# Patient Record
Sex: Male | Born: 1950 | Race: White | Hispanic: No | Marital: Married | Smoking: Former smoker
Health system: Southern US, Community
[De-identification: ages and names within clinical notes are randomized; demographics above are authoritative.]

## PROBLEM LIST (undated history)

## (undated) DIAGNOSIS — F419 Anxiety disorder, unspecified: Secondary | ICD-10-CM

## (undated) DIAGNOSIS — D649 Anemia, unspecified: Secondary | ICD-10-CM

## (undated) DIAGNOSIS — I259 Chronic ischemic heart disease, unspecified: Secondary | ICD-10-CM

## (undated) DIAGNOSIS — IMO0001 Reserved for inherently not codable concepts without codable children: Secondary | ICD-10-CM

## (undated) DIAGNOSIS — K449 Diaphragmatic hernia without obstruction or gangrene: Secondary | ICD-10-CM

## (undated) DIAGNOSIS — K2980 Duodenitis without bleeding: Secondary | ICD-10-CM

## (undated) DIAGNOSIS — K922 Gastrointestinal hemorrhage, unspecified: Secondary | ICD-10-CM

## (undated) DIAGNOSIS — Z5189 Encounter for other specified aftercare: Secondary | ICD-10-CM

## (undated) DIAGNOSIS — R6889 Other general symptoms and signs: Secondary | ICD-10-CM

## (undated) DIAGNOSIS — R972 Elevated prostate specific antigen [PSA]: Secondary | ICD-10-CM

## (undated) DIAGNOSIS — K259 Gastric ulcer, unspecified as acute or chronic, without hemorrhage or perforation: Secondary | ICD-10-CM

## (undated) DIAGNOSIS — C801 Malignant (primary) neoplasm, unspecified: Secondary | ICD-10-CM

## (undated) DIAGNOSIS — I252 Old myocardial infarction: Secondary | ICD-10-CM

## (undated) DIAGNOSIS — E785 Hyperlipidemia, unspecified: Secondary | ICD-10-CM

## (undated) HISTORY — DX: Encounter for other specified aftercare: Z51.89

## (undated) HISTORY — PX: OTHER SURGICAL HISTORY: SHX169

## (undated) HISTORY — DX: Chronic ischemic heart disease, unspecified: I25.9

## (undated) HISTORY — DX: Other general symptoms and signs: R68.89

## (undated) HISTORY — PX: TONSILLECTOMY: SUR1361

## (undated) HISTORY — DX: Duodenitis without bleeding: K29.80

## (undated) HISTORY — DX: Old myocardial infarction: I25.2

## (undated) HISTORY — DX: Gastric ulcer, unspecified as acute or chronic, without hemorrhage or perforation: K25.9

## (undated) HISTORY — DX: Malignant (primary) neoplasm, unspecified: C80.1

## (undated) HISTORY — DX: Anemia, unspecified: D64.9

## (undated) HISTORY — DX: Reserved for inherently not codable concepts without codable children: IMO0001

## (undated) HISTORY — PX: PROSTATE BIOPSY: SHX241

## (undated) HISTORY — DX: Gastrointestinal hemorrhage, unspecified: K92.2

## (undated) HISTORY — DX: Diaphragmatic hernia without obstruction or gangrene: K44.9

## (undated) HISTORY — DX: Hyperlipidemia, unspecified: E78.5

---

## 2002-10-09 DIAGNOSIS — I252 Old myocardial infarction: Secondary | ICD-10-CM

## 2002-10-09 HISTORY — DX: Old myocardial infarction: I25.2

## 2002-12-21 ENCOUNTER — Inpatient Hospital Stay (HOSPITAL_COMMUNITY): Admission: EM | Admit: 2002-12-21 | Discharge: 2002-12-26 | Payer: Self-pay | Admitting: Emergency Medicine

## 2002-12-24 ENCOUNTER — Encounter: Payer: Self-pay | Admitting: Cardiology

## 2003-01-19 ENCOUNTER — Encounter (HOSPITAL_COMMUNITY): Admission: RE | Admit: 2003-01-19 | Discharge: 2003-04-19 | Payer: Self-pay | Admitting: Cardiology

## 2003-03-08 ENCOUNTER — Inpatient Hospital Stay (HOSPITAL_COMMUNITY): Admission: EM | Admit: 2003-03-08 | Discharge: 2003-03-10 | Payer: Self-pay

## 2003-03-09 HISTORY — PX: CARDIAC CATHETERIZATION: SHX172

## 2010-09-23 ENCOUNTER — Ambulatory Visit: Payer: Self-pay | Admitting: Cardiology

## 2010-10-09 HISTORY — PX: SKIN GRAFT: SHX250

## 2011-02-06 ENCOUNTER — Other Ambulatory Visit: Payer: Self-pay | Admitting: *Deleted

## 2011-02-06 DIAGNOSIS — E7800 Pure hypercholesterolemia, unspecified: Secondary | ICD-10-CM

## 2011-02-13 ENCOUNTER — Ambulatory Visit: Payer: Self-pay | Admitting: Cardiology

## 2011-02-23 ENCOUNTER — Encounter: Payer: Self-pay | Admitting: *Deleted

## 2011-02-23 DIAGNOSIS — E785 Hyperlipidemia, unspecified: Secondary | ICD-10-CM

## 2011-02-23 DIAGNOSIS — I259 Chronic ischemic heart disease, unspecified: Secondary | ICD-10-CM

## 2011-02-23 DIAGNOSIS — I252 Old myocardial infarction: Secondary | ICD-10-CM

## 2011-02-24 ENCOUNTER — Encounter: Payer: Self-pay | Admitting: Cardiology

## 2011-02-24 ENCOUNTER — Other Ambulatory Visit (INDEPENDENT_AMBULATORY_CARE_PROVIDER_SITE_OTHER): Payer: 59 | Admitting: *Deleted

## 2011-02-24 ENCOUNTER — Ambulatory Visit (INDEPENDENT_AMBULATORY_CARE_PROVIDER_SITE_OTHER): Payer: 59 | Admitting: Cardiology

## 2011-02-24 VITALS — BP 118/82 | HR 55 | Ht 72.0 in | Wt 167.4 lb

## 2011-02-24 DIAGNOSIS — I259 Chronic ischemic heart disease, unspecified: Secondary | ICD-10-CM

## 2011-02-24 DIAGNOSIS — E7800 Pure hypercholesterolemia, unspecified: Secondary | ICD-10-CM

## 2011-02-24 DIAGNOSIS — E785 Hyperlipidemia, unspecified: Secondary | ICD-10-CM

## 2011-02-24 DIAGNOSIS — I2510 Atherosclerotic heart disease of native coronary artery without angina pectoris: Secondary | ICD-10-CM

## 2011-02-24 LAB — BASIC METABOLIC PANEL
BUN: 13 mg/dL (ref 6–23)
CO2: 29 mEq/L (ref 19–32)
Calcium: 9.4 mg/dL (ref 8.4–10.5)
Creatinine, Ser: 0.9 mg/dL (ref 0.4–1.5)
GFR: 87.57 mL/min (ref >60.00)
Glucose, Bld: 80 mg/dL (ref 70–99)
Potassium: 4.7 mEq/L (ref 3.5–5.1)
Sodium: 140 mEq/L (ref 135–145)

## 2011-02-24 LAB — HEPATIC FUNCTION PANEL
AST: 26 U/L (ref 0–37)
Alkaline Phosphatase: 64 U/L (ref 39–117)
Bilirubin, Direct: 0.1 mg/dL (ref 0.0–0.3)
Total Bilirubin: 0.6 mg/dL (ref 0.3–1.2)
Total Protein: 6.4 g/dL (ref 6.0–8.3)

## 2011-02-24 LAB — LIPID PANEL
HDL: 45.5 mg/dL (ref >39.00)
Triglycerides: 69 mg/dL (ref 0.0–149.0)
VLDL: 13.8 mg/dL (ref 0.0–40.0)

## 2011-02-24 NOTE — Cardiovascular Report (Signed)
NAME:  TREVIONE, WERT                            ACCOUNT NO.:  [id number]   MEDICAL RECORD NO.:  [id number]                   PATIENT TYPE:  INP   LOCATION:  1823                                 FACILITY:  [facility]   PHYSICIAN:  Colleen Can. Deborah Chalk, M.D.            DATE OF BIRTH:  [birth year]   DATE OF PROCEDURE:  DATE OF DISCHARGE:                              CARDIAC CATHETERIZATION   HISTORY:  The patient is a 60 year old gentleman with two days of substernal  chest pain.  He presented to the emergency room with EKG changes consistent  with a lateral myocardial infarction as well as with intermittent stuttering  chest pain and positive cardiac enzymes.  He was referred for  catheterization.   PROCEDURE:  1. Left heart catheterized with selective coronary angiography.  2. Left ventricular angiography.  3. Percutaneous coronary intervention of the left circumflex coronary.   TYPE AND SITE OF ENTRY:  Percutaneous right femoral artery.   CATHETERS:  A 6-French 4.0 curved Judkins right and left coronary catheter.  A 6-French pigtail ventricular catheter.  A 7-French JL-4 guiding, high-  torque floppy guidewire, 2.5 x20 mm Maverick balloon, a 3.0 x18 mm CYPHER  stent.   CONTRAST MATERIAL:  Omnipaque.   MEDICATIONS GIVEN DURING THE PROCEDURE:  Versed 5 mg IV, intracoronary  nitroglycerin, Integrilin and IV nitroglycerin.   COMMENTS:  The patient tolerated the procedure well.  He was quite anxious  before the procedure, but had nice relief of anxiety with Versed, but  remained awake during the procedure.   HEMODYNAMIC DATA:  The aortic pressure was 100/66, LV 100/2-13.  There is no  aortic valve gradient seen on pullback.   ANGIOGRAPHIC DATA:  1. The left main coronary artery has a 10% distal left narrowing.  2. The left circumflex:  The left circumflex is totally occluded after a     small, proximal obtuse marginal.  The obtuse marginal arises     approximately 3 cm from the  origin.  3. Left anterior descending:  The left anterior descending is a moderate-     sized vessel that extends around the apex.  There is a high-diagonal     vessel, with 50-70% segmental narrowing proximally.  It would be possible     to be approached with PCI, although it does arise in the proximal     anterior descending distribution.  It is also such that it probably could     be managed medically, but it is a reasonably large vessel.  4. Right coronary artery:  The right coronary artery is a dominant vessel.     It has segmental 50% narrowing proximally.  There is 50-60% ostial     posterior descending narrowing present.  There are no collaterals to the     distal left circumflex from the right coronary artery.   LEFT VENTRICULAR ANGIOGRAM:  The left ventricular angiogram is performed  in  the RAO position.  In the RAO position,  the ejection fraction was basically  in the normal ranges with only minimal inferoapical hypokinesis.  The global  ejection fraction was estimated to be 60.   ANGIOPLASTY PROCEDURE:  Using a JL-4 guide, we were able to cross the lesion  with a high torque floppy guidewire using the maverick balloon for support.  We were then able to dilate with the maverick balloon both initially, and  then further down in the vessel.  We injected intracoronary nitroglycerin  and subsequently distal spasm was relieved with satisfactory flow.  It was  felt that the area of the vessel with irregularity, involved in acute clot  and plaque formation was in the proximal segment.  A 3.0 x18 mm CYPHER stent  was placed with inflation to 12 atmospheres.  The final angiographic result  was felt to be excellent with slight over-expansion of the artery with a  CYPHER stent, but with no evidence of distal intimal tears.  Overall, the  patient tolerated the procedure well.   OVERALL IMPRESSION:  1. Acute posterolateral myocardial infarction with occlusion of the left     circumflex  with successful angioplasty.  2. Residual mild-to-moderate coronary atherosclerosis in the right coronary     artery and proximal large diagonal vessel with minimal atherosclerosis in     the distal left main coronary artery.   DISCUSSION:  It is felt that probably we can manage this patient medically  at this point in time.  He will need aggressive risk factor intervention  since he has not had medical care for over 15 years.  He will need to stop  smoking.  It may be that he would be a candidate for PCI of the proximal  diagonal vessel.                                               Colleen Can. Deborah Chalk, M.D.    SNT/MEDQ  D:  12/21/2002  T:  12/21/2002  Job:  [id number]

## 2011-02-24 NOTE — H&P (Signed)
Ricardo Arnold, Ricardo Arnold                            ACCOUNT NO.:  [id number]   MEDICAL RECORD NO.:  [id number]                   PATIENT TYPE:  INP   LOCATION:  2040                                 FACILITY:  [facility]   PHYSICIAN:  Colleen Can. Deborah Chalk, M.D.            DATE OF BIRTH:  02/08/1951   DATE OF ADMISSION:  03/08/2003  DATE OF DISCHARGE:                                HISTORY & PHYSICAL   REASON FOR ADMISSION:  Unstable angina, to rule out myocardial infarction.   HISTORY OF PRESENT ILLNESS:  The patient is a 60 year old male with a  previous myocardial infarction and stent of left circumflex on December 22, 2002.  He was playing music last night and a camped out after he played.  He  did not sleep well.  He drove home earlier on the day of admission and  unloaded his truck.  He subsequently mowed yard with a riding mower.  He  then developed a vague substernal chest pressure and indigestion-like  symptoms with vague radiation to his arm.  Nitroglycerin brought about  relief.  He had recurrent discomfort with relief again with nitroglycerin.  He took Pepto-Bismol and then over 20 to 30 minutes later he had increasing  anxiety and vague left anterior numbness and was referred to the emergency  room.  At the time of his catheterization in mid March of 2004, he had left  circumflex occlusion and subsequent stent placement with 3.0 x 18 mm Cypher  stent.  He had residual mild to moderate coronary atherosclerosis with 50 to  60% narrowing of the right coronary artery and a proximal diagonal vessel  with moderate 50 to 60% narrowing and minimal atherosclerosis in the left  main coronary artery.   CURRENT MEDICATIONS:  1. Plavix 75 mg a day.  2. Aspirin.  3. Lipitor 10 mg a day.   PAST MEDICAL HISTORY:  Other than myocardial infarction, this is essentially  negative.  He has not had any real care medically for 15 years prior to his  infarction.   PAST SURGICAL HISTORY:  None.   ALLERGIES:  No known drug allergies.   FAMILY HISTORY:  Father had bypass surgery in February of 2004.  He was in  his 5s.  Mother is 43, alive and well.  One sister is doing well.   REVIEW OF SYSTEMS:  He has basically been doing well.  He has been  exercising in the rehab program.   PHYSICAL EXAMINATION:  GENERAL APPEARANCE:  He is a pleasant white male.  VITAL SIGNS:  Blood pressure 125/84, heart rate 61, respiratory rate 16.  HEENT:  Negative.  NECK:  Supple without bruits.  LUNGS:  Clear.  CARDIOVASCULAR:  Regular rate and rhythm.  ABDOMEN:  Soft, normal bowel sounds.  EXTREMITIES:  No edema.  Good pedal pulses.   EKG shows some age indeterminate inferior infarction.   IMPRESSION:  1.  Vague chest discomfort and indigestion, questionable myocardial ischemia.  2. Status post inferior and posterior myocardial infarction in March of 2004     with stent placement in the left circumflex.   PLAN:  Will admit. Will rule out myocardial infarction.  Will put the  patient on IV heparin and IV nitroglycerin.  Will consider cardiac  catheterization.                                               Colleen Can. Deborah Chalk, M.D.    SNT/MEDQ  D:  03/09/2003  T:  03/09/2003  Job:  [id number]

## 2011-02-24 NOTE — Progress Notes (Signed)
Subjective:   Ricardo Arnold comes in today for followup visit. Overall, he continues to do well he's riding his bike on a regular basis. He's not had any recurrent chest pain. His last stress Cardiolite study was in 2009. He had a lateral myocardial infarction in March 2004 treated with a 3.0 x 18 mm Cypher stent. Followup catheterization in 2004 showed persistent patency of the stented segment but mild to moderate diffuse coronary atherosclerosis. He is continued to do well since that time. He remains on chronic aspirin and Plavix as well as statin therapy.   Current Outpatient Prescriptions  Medication Sig Dispense Refill  . aspirin 81 MG tablet Take 325 mg by mouth daily.       Marland Kitchen atorvastatin (LIPITOR) 20 MG tablet Take 20 mg by mouth daily.        . clopidogrel (PLAVIX) 75 MG tablet Take 75 mg by mouth daily.          No Known Allergies  Patient Active Problem List  Diagnoses  . Ischemic heart disease  . Hyperlipidemia  . History of acute lateral wall MI    History  Smoking status  . Former Smoker -- 1.0 packs/day for 32 years  . Types: Cigarettes  . Quit date: 10/09/2002  Smokeless tobacco  . Never Used    History  Alcohol Use No    Family History  Problem Relation Age of Onset  . Coronary artery disease Father     had bypass in his 59's    Review of Systems:   The patient denies any heat or cold intolerance.  No weight gain or weight loss.  The patient denies headaches or blurry vision.  There is no cough or sputum production.  The patient denies dizziness.  There is no hematuria or hematochezia.  The patient denies any muscle aches or arthritis.  The patient denies any rash.  The patient denies frequent falling or instability.  There is no history of depression or anxiety.  All other systems were reviewed and are negative.   Physical Exam:   Blood pressure is 118/82. Heart rate 55. Weight is 167.The head is normocephalic and atraumatic.  Pupils are equally round and reactive  to light.  Sclerae nonicteric.  Conjunctiva is clear.  Oropharynx is unremarkable.  There's adequate oral airway.  Neck is supple there are no masses.  Thyroid is not enlarged.  There is no lymphadenopathy.  Lungs are clear.  Chest is symmetric.  Heart shows a regular rate and rhythm.  S1 and S2 are normal.  There is no murmur click or gallop.  Abdomen is soft normal bowel sounds.  There is no organomegaly.  Genital and rectal deferred.  Extremities are without edema.  Peripheral pulses are adequate.  Neurologically intact.  Full range of motion.  The patient is not depressed.  Skin is warm and dry.  Assessment / Plan:

## 2011-02-24 NOTE — Discharge Summary (Signed)
NAME:  Ricardo Arnold, Ricardo Arnold                            ACCOUNT NO.:  [id number]   MEDICAL RECORD NO.:  [id number]                   PATIENT TYPE:  INP   LOCATION:  2003                                 FACILITY:  [facility]   PHYSICIAN:  Colleen Can. Deborah Chalk, M.D.            DATE OF BIRTH:  10/01/1951   DATE OF ADMISSION:  12/21/2002  DATE OF DISCHARGE:  12/26/2002                                 DISCHARGE SUMMARY   PRIMARY DISCHARGE DIAGNOSIS:  Lateral wall myocardial infarction with  subsequent emergent cardiac catheterization with occlusion of the left  circumflex and subsequent successful angioplasty and Cypher stent placement.  There was residual mild to moderate coronary disease in the right coronary  and the proximal large diagonal vessel with minimal disease in the distal  left main.   SECONDARY DISCHARGE DIAGNOSES:  1. Tobacco abuse.  2. Hyperlipidemia.  3. Positive family history for coronary disease.   HISTORY OF PRESENT ILLNESS:  The patient is a 60 year old male who is  married with one daughter who presents to the emergency room with EKG  changes consistent with a lateral wall myocardial infarction as well as with  intermittent stuttering chest pain and positive cardiac enzymes.  He was  seen and evaluated and referred for emergent cardiac catheterization.   Please see the dictated history and physical for further patient  presentation and profile.   LABORATORY DATA:  On admission CBC showed white count 11,000, hematocrit 38.  Chemistries were normal.  Glucose mildly elevated at 117.  Peak cardiac MB  was 160.   Total cholesterol 202, triglycerides 123, LDL 135, HDL 42.   CHEST X-RAY:  Showed no active disease.   HOSPITAL COURSE:  The patient was admitted.  He went emergently to the  Cardiac Catheterization lab and that procedure was performed with no known  complications.  There was occlusion of the left circumflex with subsequent  successful angioplasty as well as stent  placement with a 3.0 x 18 mm Cypher  stent inflated to a maximum of 12 atmospheres.  The final angiographic  result was felt to be excellent with slight over-expansion of the artery  with the stent but with no evidence of distal intimal tears.  Post-  procedure, he was transferred to the Coronary Care Unit where he remained  there for the next 24 hours.  His activity was advanced and tolerated  without problems.  Cardiac rehab was initiated.  Smoking cessation consult  was carried out.  His blood pressure has remained low throughout the  remainder of his hospitalization and has inhibited the initiation of ACE  inhibitor as well as beta blocker therapy.   Today, on December 26, 2002, he is doing well without complaints.  He has  continued to have no episodes of chest pain.  Blood pressure is only 94/50,  heart rate is in the 60s.  His overall physical exam is unremarkable and he  is felt to be a stable candidate for discharge today.   DISCHARGE CONDITION:  Improved.   DISCHARGE MEDICATIONS:  1. Plavix 75 mg a day.  2. Aspirin daily.  3. Lipitor 10 mg a day.  4. Nitroglycerin p.r.n.   ACTIVITY:  To be light with no driving, no sexual intercourse.   SPECIAL INSTRUCTIONS:  No smoking.   FOLLOW UP:  Will have him follow up in our office in approximately 10 days.  He is asked to call to set that appointment up, or certainly sooner if  problems would arise in the interim.     Juanell Fairly C. Earl Gala, N.P.                 Colleen Can. Deborah Chalk, M.D.    LCO/MEDQ  D:  12/26/2002  T:  12/27/2002  Job:  [id number]

## 2011-02-24 NOTE — Discharge Summary (Signed)
Ricardo Arnold, Ricardo Arnold                            ACCOUNT NO.:  [id number]   MEDICAL RECORD NO.:  [id number]                   PATIENT TYPE:  INP   LOCATION:  2040                                 FACILITY:  [facility]   PHYSICIAN:  Colleen Can. Deborah Chalk, M.D.            DATE OF BIRTH:  Apr 04, 1951   DATE OF ADMISSION:  03/08/2003  DATE OF DISCHARGE:  03/10/2003                                 DISCHARGE SUMMARY   PRIMARY DISCHARGE DIAGNOSIS:  Recurrent episode of chest pain with  subsequent elective cardiac catheterization, revealing proximal 20% left  main, 20-40% mid left anterior descending, 50-60% first diagonal, patent  stent in the left circumflex with a 50-60% narrowing in the small first  obtuse marginal branch, and 50-60% mid right coronary artery lesion with 60%  ostial posterior descending artery.  The patient will continue treatment  medically.  He is felt to have stable revascularization.   SECONDARY DISCHARGE DIAGNOSES:  1. Gastroesophageal reflux disease, now on proton pump inhibitor.  2. Hyperlipidemia, currently on Lipitor.  3. Previous myocardial infarction with stent placement in March 2004.   HISTORY OF PRESENT ILLNESS:  The patient is a very pleasant 60 year old  white male who has known coronary disease.  He had a previous heart attack  in March which was treated with stenting at the left circumflex.  He  presented to the hospital with complaints of chest pain.  On the night prior  to admission, he was placing music while camping out.  He did not sleep very  well.  He drove home the day of admission, unloaded his truck, and  subsequently mowed the yard.  He began to develop a vague substernal chest  pressure with indigestion-like feeling, somewhat very similar to his  previous chest pain syndrome.  He took a nitroglycerin with only short-term  relief.  However, the pain would return.  He took Pepto-Bismol after calling  the physician on call, but 20-30 minutes later, he  became quite anxious with  vague left arm numbness.  He was subsequently brought to the emergency room  and was admitted for further evaluation.   Please see dictated history and physical regarding the patient's  presentation.   LABORATORY DATA:  (On the day of admission)  Cardiac enzymes were negative.  CBC and chemistries were normal, except for a glucose of 125.  EKG showed no  acute changes.   HOSPITAL COURSE:  The patient was admitted.  We proceeded on with cardiac  catheterization the following day.  These results are as noted above.  It is  felt that at this time he may continue with medical management.  His overall  prognosis is quite good.  On March 10, 2003, he was doing well without  complaints.  He is tolerating his current medications without problems.  The  right groin is unremarkable, and he is felt to be stable for discharge  today.  CONDITION ON DISCHARGE:  Stable.   DISCHARGE MEDICATIONS:  He will resume his aspirin, Lipitor and Plavix, as  taken previously.  We will add Nexium 40 mg q.d.   ACTIVITY:  Light activity over the next few days.    WOUND CARE:  He is to place an ice pack to the wound if needed.   FOLLOW UP:  We will see him back at his regular appointment time, certainly  sooner if problems arise.     Juanell Fairly C. Earl Gala, N.P.                 Colleen Can. Deborah Chalk, M.D.    LCO/MEDQ  D:  03/10/2003  T:  03/10/2003  Job:  [id number]   cc:   Gwen Pounds, M.D.  [address]  [place]  [place] [address]  Fax: [phone]

## 2011-02-24 NOTE — H&P (Signed)
NAME:  CONTRELL, BALLENTINE                            ACCOUNT NO.:  [id number]   MEDICAL RECORD NO.:  [id number]                   PATIENT TYPE:  INP   LOCATION:  1823                                 FACILITY:  [facility]   PHYSICIAN:  Colleen Can. Deborah Chalk, M.D.            DATE OF BIRTH:  04/18/1951   DATE OF ADMISSION:  12/21/2002  DATE OF DISCHARGE:                                HISTORY & PHYSICAL   HISTORY:  Mr. Kathol is a 60 year old male.  He is married with one 12-year  old daughter.  He smokes one half pack of cigarettes a day, rarely uses  alcohol.  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   HISTORY OF PRESENT ILLNESS:  On Friday evening, two days prior to admission  he began having substernal chest pain and tightness through most of the  night.  He thought it was indigestion.  It was non radiating, there was no  nausea, diaphoresis and no shortness of breath.  It was somewhat steady  until he fell asleep.  He had similar chest pain on Saturday.  It returned  Saturday evening.  He did not get much sleep last night.  The discomfort  radiated somewhat to his shoulders.  He awoke and was uncomfortable.  It has  persisted mainly throughout the day but it has off and on quality and  feature.  He presented to the emergency room for evaluation.  EKG was  abnormal as well as positive CK and troponin's.  He is referred for  evaluation.   PAST MEDICAL HISTORY:  He has not really had any medical care for 15 years  but notes that he has been in good health.  There is no hypertension or  diabetes that he knows about.  He basically has been free of any significant  symptoms.   PAST SURGICAL HISTORY:  None.   ALLERGIES:  None.   MEDICATIONS:  None.   FAMILY HISTORY:  Father had bypass surgery in February of this year.  He was  in his 51's.  Mother is 70 and alive and well.  One sister is  okay.   REVIEW OF SYSTEMS:  He has had a mild cough and cold in the last two weeks  otherwise review of systems is totally within normal limits.   PHYSICAL EXAMINATION:  GENERAL:  He is a pleasant, quiet, thin white male.  VITALS:  Heart rate is 54, blood pressure is 90/54.  HEENT:  Negative.  Oropharynx is unremarkable.  NECK:  Supple, without bruits.  LUNGS:  Clear.  HEART:  Regular rate and rhythm, no murmur.  ABDOMEN:  Soft, nontender.  EXTREMITIES:  Without edema.  Peripheral pulses are 2+.   LABORATORY DATA:  His EKG shows an RSR prime in 1 and aVL with an S-T  elevation mildly, is normal sinus  rhythm.   Enzymes - cardiac enzymes are positive (I do not have exact numbers in front  of me).   His BUN, creatinine and chemistry are all within normal limits.  Hematocrit  is 50 and CO2 is 46.   IMPRESSION:  1. Probable acute lateral myocardial infarction of greater than 24 hours     duration but has stuttering symptoms.  2. Cigarette abuse.   PLAN:  Will proceed on with acute catheterization.  Procedure, risks and  benefits have been explained.  The patient is concerned about anxieties  about blood and is concerned about his fear of that.  We will sedate him and  try to proceed cautiously in that manner to avoid vagal symptoms.  The risks  of heart attack, stroke, ___________, death, allergy and bleeding have all  been explained and the patient is willing to proceed.                                               Colleen Can. Deborah Chalk, M.D.    SNT/MEDQ  D:  12/21/2002  T:  12/22/2002  Job:  [id number]

## 2011-02-24 NOTE — Cardiovascular Report (Signed)
NAMEARTEMIS, LOYAL                            ACCOUNT NO.:  [id number]   MEDICAL RECORD NO.:  [id number]                   PATIENT TYPE:  INP   LOCATION:  2040                                 FACILITY:  [facility]   PHYSICIAN:  Colleen Can. Deborah Chalk, M.D.            DATE OF BIRTH:  Jul 22, 1951   DATE OF PROCEDURE:  03/09/2003  DATE OF DISCHARGE:                              CARDIAC CATHETERIZATION   PROCEDURE:  Left heart catheterization with selective coronary angiography,  left ventricular angiography.   HISTORY:  The patient presents with recurrent chest discomfort compatible  with indigestion.  He had previous stent placement in the left circumflex  after having a lateral myocardial infarction with stuttering symptoms on  12/22/02.  He is referred now for catheterization.   TYPE AND SITE OF ENTRY:  Percutaneous right femoral artery.   CATHETERS:  6 French 4 curved Judkins right and left coronary catheters, 6  French pigtail ventriculographic catheter.   CONTRAST MATERIAL:  Omnipaque.   MEDICATIONS PRIOR TO PROCEDURE:  Valium 10 mg p.o.   MEDICATIONS GIVEN DURING PROCEDURE:  Versed 3 mg IV.   COMMENT:  The patient tolerated the procedure well.   HEMODYNAMIC DATA:  1. The aortic pressure was 99/63.  2. LV was 101/0-9.  3. There was no aortic valve gradient noted on pullback.   ANGIOGRAPHIC DATA:  The left ventricular angiogram was performed in RAO  projection.  Overall cardiac size and silhouette were normal, and there was  bilateral hypokinesis with a global ejection fraction of 50.   CORONARY ARTERIES:  The coronary arteries arise and distribute normally.   1. Left main coronary artery:  The left main coronary artery has a 10-20%     ostial narrowing.  2. Left anterior descending:  The left anterior descending is a reasonably     long vessel that wraps around the apex.  There is 20-40% segmental     narrowing in the midportion of the left anterior descending.  The first    diagonal vessel had a 50-60% narrowing in a segmental length.  It is a     moderate-sized vessel.  3. Left circumflex:  The left circumflex is a moderate-sized vessel with a     posterolateral branch.  The stent is widely patent.  The proximal obtuse     marginal has a 50-60% narrowing, but it is small and would not be     suitable for angioplasty.  4. Right coronary artery:  The right coronary artery has a 40-50% narrowing     in its midportion.  There is a 60% narrowing at the ostium of the     posterior descending artery.   LEFT VENTRICULAR ANGIOGRAM:  Left ventricular angiogram was performed in the  RAO position.  Overall cardiac size and silhouette were normal.  Global  ejection fraction was 50% with lateral hypokinesis.  There was no mitral  regurgitation.  IMPRESSION:  1. Persistent patency of the stent from the left circumflex.  2. Mild lateral hypokinesis with well-preserved global left ventricular     function.  3. There is mild to moderate diffuse coronary atherosclerosis otherwise.   DISCUSSION:  It is felt that we can probably manage the patient medically at  this point in time.  It is doubtful that he actually has obstructive  coronary disease leading to angina.                                               Colleen Can. Deborah Chalk, M.D.    SNT/MEDQ  D:  03/09/2003  T:  03/09/2003  Job:  [id number]

## 2011-02-25 NOTE — Assessment & Plan Note (Signed)
His current lipids are excellent. HDL cholesterol is low but his total cholesterol levels are markedly depressed and he has excellent control of his LDL cholesterol. We'll continue current medication

## 2011-02-25 NOTE — Assessment & Plan Note (Signed)
I will arrange for him to have a stress Cardiolite study. We will have followup with Dr. Swaziland in 8 months.

## 2011-03-02 ENCOUNTER — Telehealth: Payer: Self-pay | Admitting: *Deleted

## 2011-03-02 NOTE — Telephone Encounter (Signed)
L/M for pt to call back regarding lab work

## 2011-03-03 ENCOUNTER — Telehealth: Payer: Self-pay | Admitting: *Deleted

## 2011-03-03 NOTE — Telephone Encounter (Signed)
Labs reported to pt 

## 2011-03-07 ENCOUNTER — Encounter (HOSPITAL_COMMUNITY): Payer: 59 | Admitting: Radiology

## 2011-03-09 ENCOUNTER — Telehealth: Payer: Self-pay | Admitting: *Deleted

## 2011-03-09 NOTE — Telephone Encounter (Signed)
Pt notified that paperwork regarding [employer] is ready for pt to pick up.  Pt will come by in the next few days to pick up.

## 2011-03-14 ENCOUNTER — Ambulatory Visit (HOSPITAL_COMMUNITY): Payer: 59 | Attending: Cardiology | Admitting: Radiology

## 2011-03-14 VITALS — Ht 72.0 in | Wt 168.0 lb

## 2011-03-14 DIAGNOSIS — R0602 Shortness of breath: Secondary | ICD-10-CM

## 2011-03-14 DIAGNOSIS — I2510 Atherosclerotic heart disease of native coronary artery without angina pectoris: Secondary | ICD-10-CM | POA: Insufficient documentation

## 2011-03-14 NOTE — Progress Notes (Signed)
[facility] SITE 3 NUCLEAR MED [address] [place] [address] [phone]  Cardiology Nuclear Med Study  Ricardo Arnold is a 60 y.o. male [id number] [birth year]   Nuclear Med Background Indication for Stress Test:  Evaluation for Ischemia and Stent Patency History: 04 Echo: EF=55-65%,04 Heart Catheterization:EF=50%,patent stent,mild-moderate diffuse CAD,RCA and LAD,04 Myocardial Infarction, 09 Myocardial Perfusion Study EF=49%,mild lateral hypokinesia,  And 04 Stents: CFX Cardiac Risk Factors: Family History - CAD, History of Smoking and Lipids  Symptoms:  Dizziness and Light-Headedness   Nuclear Pre-Procedure Caffeine/Decaff Intake:  None NPO After: 4:00am   Lungs:  clear IV 0.9% NS with Angio Cath:  18g  IV Site: R Antecubital  IV Started by:  Stanton Kidney, EMT-P  Chest Size (in):  42 Cup Size: n/a  Height: 6' (1.829 m)  Weight:  168 lb (76.204 kg)  BMI:  Body mass index is 22.78 kg/(m^2). Tech Comments:  NA    Nuclear Med Study 1 or 2 day study: 1 day  Stress Test Type:  Stress  Reading MD: Charlton Haws, MD  Order Authorizing Provider:  Fermin Schwab  Resting Radionuclide: Technetium 24m Tetrofosmin  Resting Radionuclide Dose: 11 mCi   Stress Radionuclide:  Technetium 25m Tetrofosmin  Stress Radionuclide Dose: 33 mCi           Stress Protocol Rest HR: 60 Stress HR: 157  Rest BP: 108/66 Stress BP: 157/72  Exercise Time (min): 12:30 METS: 14.3   Predicted Max HR: 162 bpm % Max HR: 96.91 bpm Rate Pressure Product: 21308   Dose of Adenosine (mg):  n/a Dose of Lexiscan: n/a mg  Dose of Atropine (mg): n/a Dose of Dobutamine: n/a mcg/kg/min (at max HR)  Stress Test Technologist: Cathlyn Parsons, RN  Nuclear Technologist:  Domenic Polite, CNMT     Rest Procedure:  Myocardial perfusion imaging was performed at rest 45 minutes following the intravenous administration of Technetium 36m Tetrofosmin. Rest ECG: NSR with abnormal ST T wave   Stress  Procedure:  The patient exercised for 12:30.  The patient stopped due to fatigue and SOB and denied any chest pain.  There were some ST-T wave changes. Patient had no chest pain and no ectopy. Technetium 67m Tetrofosmin was injected at peak exercise and myocardial perfusion imaging was performed after a brief delay. Stress ECG: No significant change from baseline ECG  QPS Raw Data Images:  Normal; no motion artifact; normal heart/lung ratio. Stress Images:  There is decreased uptake in the lateral wall. Rest Images:  There is decreased uptake in the lateral wall. Subtraction (SDS):  There is a fixed defect that is most consistent with a previous infarction. Transient Ischemic Dilatation (Normal <1.22):  0.99 Lung/Heart Ratio (Normal <0.45):  0.28  Quantitative Gated Spect Images QGS EDV:  97 ml QGS ESV:  48 ml QGS cine images:  Lateral wall hypokinesis QGS EF: 50%  Impression Exercise Capacity:  Excellent exercise capacity. BP Response:  Normal blood pressure response. Clinical Symptoms:  There is dyspnea. ECG Impression:  No significant ST segment change suggestive of ischemia. Comparison with Prior Nuclear Study: No images to compare  Overall Impression:  Moderate lateral wall infarct from apex to base with no ishcemia  EF 50% Similar to prevoiusly described myovue       Charlton Haws

## 2011-03-15 NOTE — Progress Notes (Signed)
Low risk

## 2011-03-15 NOTE — Progress Notes (Signed)
Copy to Dr. Johnette Abraham

## 2011-03-21 ENCOUNTER — Telehealth: Payer: Self-pay | Admitting: *Deleted

## 2011-03-21 NOTE — Telephone Encounter (Signed)
Left message on cell voicemail with nuclear results.  Pt told to call back with any concerns.

## 2011-03-27 ENCOUNTER — Other Ambulatory Visit: Payer: Self-pay | Admitting: Cardiology

## 2011-08-10 DIAGNOSIS — C801 Malignant (primary) neoplasm, unspecified: Secondary | ICD-10-CM

## 2011-08-10 HISTORY — DX: Malignant (primary) neoplasm, unspecified: C80.1

## 2011-11-07 ENCOUNTER — Other Ambulatory Visit: Payer: Self-pay | Admitting: Cardiology

## 2011-11-07 MED ORDER — CLOPIDOGREL BISULFATE 75 MG PO TABS: 75.0000 mg | ORAL_TABLET | Freq: Every day | ORAL | Status: DC

## 2011-12-03 ENCOUNTER — Other Ambulatory Visit: Payer: Self-pay | Admitting: Cardiology

## 2011-12-05 ENCOUNTER — Other Ambulatory Visit: Payer: Self-pay | Admitting: Cardiology

## 2012-01-03 ENCOUNTER — Encounter: Payer: Self-pay | Admitting: Cardiology

## 2012-01-03 ENCOUNTER — Ambulatory Visit (INDEPENDENT_AMBULATORY_CARE_PROVIDER_SITE_OTHER): Payer: 59 | Admitting: Cardiology

## 2012-01-03 VITALS — BP 126/84 | HR 60 | Ht 72.0 in | Wt 177.0 lb

## 2012-01-03 DIAGNOSIS — I259 Chronic ischemic heart disease, unspecified: Secondary | ICD-10-CM

## 2012-01-03 DIAGNOSIS — I2510 Atherosclerotic heart disease of native coronary artery without angina pectoris: Secondary | ICD-10-CM

## 2012-01-03 DIAGNOSIS — E785 Hyperlipidemia, unspecified: Secondary | ICD-10-CM

## 2012-01-03 NOTE — Assessment & Plan Note (Signed)
He will continue with atorvastatin. He is scheduled for followup fasting lab work including chemistries and lipid panel.

## 2012-01-03 NOTE — Progress Notes (Signed)
Subjective:   Ricardo Arnold comes in today to establish cardiac care. He is a former patient of Dr. Deborah Chalk.   He has a history of a lateral myocardial infarction in March 2004 treated with a 3.0 x 18 mm Cypher stent. Followup catheterization in 2004 showed persistent patency of the stented segment but mild to moderate diffuse coronary atherosclerosis. He had followup nuclear stress test in 2009 and 2012 which showed a fixed lateral wall defect area and there was no ischemia. Ejection fraction was 50%. He is continued to do well since that time. He denies any recurrent chest pain or shortness of breath. He feels that he is in good health. He remains on chronic aspirin and Plavix as well as statin therapy.   Current Outpatient Prescriptions  Medication Sig Dispense Refill  . aspirin 81 MG tablet Take 325 mg by mouth daily.       Marland Kitchen atorvastatin (LIPITOR) 20 MG tablet TAKE 1 TABLET EVERY DAY  30 tablet  6  . clopidogrel (PLAVIX) 75 MG tablet TAKE 1 TABLET EVERY DAY  30 tablet  0    No Known Allergies  Patient Active Problem List  Diagnoses  . Ischemic heart disease  . Hyperlipidemia  . History of acute lateral wall MI    History  Smoking status  . Former Smoker -- 1.0 packs/day for 32 years  . Types: Cigarettes  . Quit date: 10/09/2002  Smokeless tobacco  . Never Used    History  Alcohol Use No    Family History  Problem Relation Age of Onset  . Coronary artery disease Father     had bypass in his 69's  . Heart disease Father   . Heart attack Father     Review of Systems:   As noted in history of present illness.  All other systems were reviewed and are negative.   Physical Exam:   Blood pressure is 118/82. Heart rate 55. Weight is 167.The head is normocephalic and atraumatic.  Pupils are equally round and reactive to light.  Sclerae nonicteric.  Conjunctiva is clear.  Oropharynx is unremarkable.  There's adequate oral airway.  Neck is supple there are no masses.  Thyroid is not  enlarged.  There is no lymphadenopathy.  Lungs are clear.  Chest is symmetric.  Heart shows a regular rate and rhythm.  S1 and S2 are normal.  There is no murmur click or gallop.  Abdomen is soft normal bowel sounds.  There is no organomegaly.  Genital and rectal deferred.  Extremities are without edema.  Peripheral pulses are adequate.  Neurologically intact.  Full range of motion.  The patient is not depressed.  Skin is warm and dry.  Laboratory data: ECG today shows normal sinus rhythm with left axis deviation and nonspecific T-wave abnormality in leads 1 and aVL.  Assessment / Plan:

## 2012-01-03 NOTE — Patient Instructions (Signed)
Continue your current medication. Get back into your exercise routine.  We will schedule you for fasting lab work.  I will see you again in 1 year with fasting lab.

## 2012-01-03 NOTE — Assessment & Plan Note (Signed)
Status post stenting of the LAD with a 3.0 x  18 mm Cypher stent in 2004. He is asymptomatic. Followup stress test one year ago showed no ischemia. I recommended continued long-term use of aspirin and Plavix. He will continue with statin therapy.

## 2012-01-11 ENCOUNTER — Other Ambulatory Visit (INDEPENDENT_AMBULATORY_CARE_PROVIDER_SITE_OTHER): Payer: 59

## 2012-01-11 DIAGNOSIS — E785 Hyperlipidemia, unspecified: Secondary | ICD-10-CM

## 2012-01-11 DIAGNOSIS — I2510 Atherosclerotic heart disease of native coronary artery without angina pectoris: Secondary | ICD-10-CM

## 2012-01-11 LAB — LIPID PANEL
HDL: 53 mg/dL (ref >39.00)
Triglycerides: 92 mg/dL (ref 0.0–149.0)
VLDL: 18.4 mg/dL (ref 0.0–40.0)

## 2012-01-11 LAB — HEPATIC FUNCTION PANEL
ALT: 22 U/L (ref 0–53)
Albumin: 4 g/dL (ref 3.5–5.2)
Total Bilirubin: 0.8 mg/dL (ref 0.3–1.2)

## 2012-01-11 LAB — BASIC METABOLIC PANEL
GFR: 88.38 mL/min (ref >60.00)
Glucose, Bld: 89 mg/dL (ref 70–99)
Potassium: 4.4 mEq/L (ref 3.5–5.1)
Sodium: 140 mEq/L (ref 135–145)

## 2012-01-16 ENCOUNTER — Other Ambulatory Visit: Payer: Self-pay

## 2012-01-16 DIAGNOSIS — E785 Hyperlipidemia, unspecified: Secondary | ICD-10-CM

## 2012-01-16 MED ORDER — ATORVASTATIN CALCIUM 40 MG PO TABS: 40.0000 mg | ORAL_TABLET | Freq: Every day | ORAL | Status: DC

## 2012-01-22 ENCOUNTER — Other Ambulatory Visit: Payer: Self-pay | Admitting: Cardiology

## 2012-04-08 ENCOUNTER — Other Ambulatory Visit (INDEPENDENT_AMBULATORY_CARE_PROVIDER_SITE_OTHER): Payer: 59

## 2012-04-08 DIAGNOSIS — E785 Hyperlipidemia, unspecified: Secondary | ICD-10-CM

## 2012-04-08 LAB — HEPATIC FUNCTION PANEL
ALT: 19 U/L (ref 0–53)
Bilirubin, Direct: 0.1 mg/dL (ref 0.0–0.3)
Total Bilirubin: 0.7 mg/dL (ref 0.3–1.2)

## 2012-04-08 LAB — LIPID PANEL
Cholesterol: 152 mg/dL (ref 0–200)
HDL: 56.8 mg/dL (ref >39.00)
LDL Cholesterol: 79 mg/dL (ref 0–99)
Total CHOL/HDL Ratio: 3
Triglycerides: 81 mg/dL (ref 0.0–149.0)
VLDL: 16.2 mg/dL (ref 0.0–40.0)

## 2012-04-17 ENCOUNTER — Other Ambulatory Visit: Payer: 59

## 2012-05-13 ENCOUNTER — Inpatient Hospital Stay (HOSPITAL_BASED_OUTPATIENT_CLINIC_OR_DEPARTMENT_OTHER)
Admission: EM | Admit: 2012-05-13 | Discharge: 2012-05-16 | DRG: 378 | Disposition: A | Discharge status: Home / Self Care | Payer: 59 | Attending: Family Medicine | Admitting: Family Medicine

## 2012-05-13 ENCOUNTER — Encounter (HOSPITAL_BASED_OUTPATIENT_CLINIC_OR_DEPARTMENT_OTHER): Payer: Self-pay | Admitting: *Deleted

## 2012-05-13 ENCOUNTER — Inpatient Hospital Stay (HOSPITAL_COMMUNITY): Payer: 59

## 2012-05-13 ENCOUNTER — Telehealth: Payer: Self-pay | Admitting: Physician Assistant

## 2012-05-13 DIAGNOSIS — E785 Hyperlipidemia, unspecified: Secondary | ICD-10-CM

## 2012-05-13 DIAGNOSIS — D500 Iron deficiency anemia secondary to blood loss (chronic): Secondary | ICD-10-CM | POA: Diagnosis present

## 2012-05-13 DIAGNOSIS — T4595XA Adverse effect of unspecified primarily systemic and hematological agent, initial encounter: Secondary | ICD-10-CM | POA: Diagnosis present

## 2012-05-13 DIAGNOSIS — K922 Gastrointestinal hemorrhage, unspecified: Secondary | ICD-10-CM | POA: Diagnosis present

## 2012-05-13 DIAGNOSIS — Z79899 Other long term (current) drug therapy: Secondary | ICD-10-CM

## 2012-05-13 DIAGNOSIS — I252 Old myocardial infarction: Secondary | ICD-10-CM

## 2012-05-13 DIAGNOSIS — Z7982 Long term (current) use of aspirin: Secondary | ICD-10-CM

## 2012-05-13 DIAGNOSIS — I259 Chronic ischemic heart disease, unspecified: Secondary | ICD-10-CM

## 2012-05-13 DIAGNOSIS — I2510 Atherosclerotic heart disease of native coronary artery without angina pectoris: Secondary | ICD-10-CM | POA: Diagnosis present

## 2012-05-13 DIAGNOSIS — D62 Acute posthemorrhagic anemia: Secondary | ICD-10-CM | POA: Diagnosis present

## 2012-05-13 DIAGNOSIS — D509 Iron deficiency anemia, unspecified: Secondary | ICD-10-CM | POA: Diagnosis present

## 2012-05-13 DIAGNOSIS — T39095A Adverse effect of salicylates, initial encounter: Secondary | ICD-10-CM | POA: Diagnosis present

## 2012-05-13 DIAGNOSIS — K254 Chronic or unspecified gastric ulcer with hemorrhage: Principal | ICD-10-CM | POA: Diagnosis present

## 2012-05-13 DIAGNOSIS — Z951 Presence of aortocoronary bypass graft: Secondary | ICD-10-CM

## 2012-05-13 HISTORY — DX: Anxiety disorder, unspecified: F41.9

## 2012-05-13 LAB — POCT I-STAT, CHEM 8
BUN: 9 mg/dL (ref 6–23)
Calcium, Ion: 1.27 mmol/L — ABNORMAL HIGH (ref 1.12–1.23)
Creatinine, Ser: 0.9 mg/dL (ref 0.50–1.35)
Hemoglobin: 8.2 g/dL — ABNORMAL LOW (ref 13.0–17.0)
Sodium: 141 mEq/L (ref 135–145)
TCO2: 22 mmol/L (ref 0–100)

## 2012-05-13 LAB — CBC WITH DIFFERENTIAL/PLATELET
Basophils Absolute: 0 10*3/uL (ref 0.0–0.1)
Basophils Relative: 0 % (ref 0–1)
Eosinophils Absolute: 0.1 10*3/uL (ref 0.0–0.7)
MCH: 29.8 pg (ref 26.0–34.0)
MCHC: 32.5 g/dL (ref 30.0–36.0)
Monocytes Relative: 8 % (ref 3–12)
Neutro Abs: 6.8 10*3/uL (ref 1.7–7.7)
Neutrophils Relative %: 71 % (ref 43–77)
Platelets: 209 10*3/uL (ref 150–400)
RDW: 15.1 % (ref 11.5–15.5)

## 2012-05-13 LAB — PREPARE RBC (CROSSMATCH)

## 2012-05-13 LAB — OCCULT BLOOD X 1 CARD TO LAB, STOOL: Fecal Occult Bld: POSITIVE

## 2012-05-13 LAB — PROTIME-INR: Prothrombin Time: 14 seconds (ref 11.6–15.2)

## 2012-05-13 LAB — MRSA PCR SCREENING: MRSA by PCR: NEGATIVE

## 2012-05-13 MED ORDER — ONDANSETRON HCL 4 MG PO TABS: 4.0000 mg | ORAL_TABLET | Freq: Four times a day (QID) | ORAL | Status: DC | PRN

## 2012-05-13 MED ORDER — LORAZEPAM 2 MG/ML IJ SOLN
INTRAMUSCULAR | Status: AC
  Administered 2012-05-13: 0.5 mg via INTRAVENOUS
  Filled 2012-05-13: qty 1

## 2012-05-13 MED ORDER — HYDROCODONE-ACETAMINOPHEN 5-325 MG PO TABS: 1.0000 | ORAL_TABLET | ORAL | Status: DC | PRN

## 2012-05-13 MED ORDER — SODIUM CHLORIDE 0.9 % IV SOLN
8.0000 mg/h | INTRAVENOUS | Status: DC
  Administered 2012-05-13 – 2012-05-15 (×5): 8 mg/h via INTRAVENOUS
  Filled 2012-05-13 (×14): qty 80

## 2012-05-13 MED ORDER — SODIUM CHLORIDE 0.9 % IV BOLUS (SEPSIS)
1000.0000 mL | Freq: Once | INTRAVENOUS | Status: AC
  Administered 2012-05-13: 1000 mL via INTRAVENOUS

## 2012-05-13 MED ORDER — LORAZEPAM 2 MG/ML IJ SOLN
0.5000 mg | INTRAMUSCULAR | Status: DC | PRN
  Administered 2012-05-13 – 2012-05-15 (×3): 0.5 mg via INTRAVENOUS
  Filled 2012-05-13 (×3): qty 1

## 2012-05-13 MED ORDER — ONDANSETRON HCL 4 MG/2ML IJ SOLN: 4.0000 mg | Freq: Four times a day (QID) | INTRAMUSCULAR | Status: DC | PRN

## 2012-05-13 MED ORDER — LORAZEPAM 2 MG/ML IJ SOLN
0.5000 mg | Freq: Once | INTRAMUSCULAR | Status: AC
  Administered 2012-05-13: 0.5 mg via INTRAVENOUS

## 2012-05-13 MED ORDER — SODIUM CHLORIDE 0.9 % IJ SOLN
3.0000 mL | Freq: Two times a day (BID) | INTRAMUSCULAR | Status: DC
  Administered 2012-05-13 – 2012-05-16 (×4): 3 mL via INTRAVENOUS

## 2012-05-13 MED ORDER — SODIUM CHLORIDE 0.9 % IV SOLN: INTRAVENOUS | Status: DC

## 2012-05-13 MED ORDER — SODIUM CHLORIDE 0.9 % IV SOLN
80.0000 mg | Freq: Once | INTRAVENOUS | Status: AC
  Administered 2012-05-13: 80 mg via INTRAVENOUS
  Filled 2012-05-13 (×2): qty 40

## 2012-05-13 MED ORDER — GUAIFENESIN-DM 100-10 MG/5ML PO SYRP: 5.0000 mL | ORAL_SOLUTION | ORAL | Status: DC | PRN

## 2012-05-13 MED ORDER — ALBUTEROL SULFATE (5 MG/ML) 0.5% IN NEBU: 2.5000 mg | INHALATION_SOLUTION | RESPIRATORY_TRACT | Status: DC | PRN

## 2012-05-13 NOTE — Consult Note (Signed)
Referring Provider: Dr. Charlotta Newton Primary Care Physician:  No primary provider on file. Dr. Jordan-cardiology Primary Gastroenterologist:  none  Reason for Consultation:  GI Bleed  HPI: Ricardo Arnold is a 61 y.o. male who was admitted this afternoon through the emergency room. He has history of coronary artery disease and is status post MI in 2004. He had a bare-metal stent placed at that time and has been maintained on a regular aspirin and Plavix since. He has no prior GI history and has not had previous endoscopy or colonoscopy. He says he had onset of symptoms about a week ago with intermittent indigestion fullness in increase in belching. He said he started feeling weak and then on Friday a 2 had some weakness and lightheadedness and neck she had a syncopal episode at home after climbing a flight of stairs. On that same day he started noticing black stools and has been having 2 to 3 black bowel movements daily since He had been monitoring his blood pressure and pulse at home over the weekend and says that his blood pressure was okay but his pulse was over 100 with any exertion. Yesterday he says he felt pretty good and then this morning felt flushed lightheaded and had a pulse of 105 in addition to black stools so came onto the emergency room. He had stopped his aspirin as of Saturday, he did take Plavix yesterday and had not taken in today. In the emergency room he was mildly hypotensive with systolic blood pressure in the high 90s. Hemoglobin 7.9. His last bowel movement was early this morning.   Past Medical History  Diagnosis Date  . Ischemic heart disease   . Hyperlipidemia   . History of acute lateral wall MI 2004    treated with a 3.0 x 18 mm Cypher stent/   . Hypokinesia     with EF of 49%  /by  cardiolite study  . Coronary atherosclerosis   . Anxiety     Past Surgical History  Procedure Date  . Cardiac catheterization 03/09/2003    EF  50%   . Cardiac stents     2004  .  Tonsillectomy     Prior to Admission medications   Medication Sig Start Date End Date Taking? Authorizing Provider  aspirin 325 MG buffered tablet Take 325 mg by mouth daily.   Yes Historical Provider, MD  atorvastatin (LIPITOR) 40 MG tablet Take 40 mg by mouth daily. 01/16/12 01/15/13 Yes Peter M Swaziland, MD  clopidogrel (PLAVIX) 75 MG tablet Take 75 mg by mouth daily.   Yes Historical Provider, MD  Multiple Vitamins-Minerals (CENTRUM SILVER PO) Take 1 tablet by mouth daily.   Yes Historical Provider, MD  aspirin 81 MG tablet Take 325 mg by mouth daily.     Historical Provider, MD    Current Facility-Administered Medications  Medication Dose Route Frequency Provider Last Rate Last Dose  . 0.9 %  sodium chloride infusion   Intravenous STAT Gerhard Munch, MD      . albuterol (PROVENTIL) (5 MG/ML) 0.5% nebulizer solution 2.5 mg  2.5 mg Nebulization Q2H PRN Leroy Sea, MD      . guaiFENesin-dextromethorphan (ROBITUSSIN DM) 100-10 MG/5ML syrup 5 mL  5 mL Oral Q4H PRN Leroy Sea, MD      . HYDROcodone-acetaminophen (NORCO/VICODIN) 5-325 MG per tablet 1-2 tablet  1-2 tablet Oral Q4H PRN Leroy Sea, MD      . LORazepam (ATIVAN) injection 0.5 mg  0.5 mg Intravenous Once  Gerhard Munch, MD   0.5 mg at 05/13/12 1322  . LORazepam (ATIVAN) injection 0.5 mg  0.5 mg Intravenous Q4H PRN Gerhard Munch, MD   0.5 mg at 05/13/12 1420  . ondansetron (ZOFRAN) tablet 4 mg  4 mg Oral Q6H PRN Leroy Sea, MD       Or  . ondansetron (ZOFRAN) injection 4 mg  4 mg Intravenous Q6H PRN Leroy Sea, MD      . pantoprazole (PROTONIX) 80 mg in sodium chloride 0.9 % 100 mL IVPB  80 mg Intravenous Once Gerhard Munch, MD   80 mg at 05/13/12 1323  . pantoprazole (PROTONIX) 80 mg in sodium chloride 0.9 % 250 mL infusion  8 mg/hr Intravenous Continuous Leroy Sea, MD      . sodium chloride 0.9 % bolus 1,000 mL  1,000 mL Intravenous Once Gerhard Munch, MD   1,000 mL at 05/13/12 1226  .  sodium chloride 0.9 % injection 3 mL  3 mL Intravenous Q12H Leroy Sea, MD        Allergies as of 05/13/2012  . (No Known Allergies)    Family History  Problem Relation Age of Onset  . Coronary artery disease Father     had bypass in his 88's  . Heart disease Father   . Heart attack Father     History   Social History  . Marital Status: Married    Spouse Name: N/A    Number of Children: 1  . Years of Education: N/A   Occupational History  . engineer    Social History Main Topics  . Smoking status: Former Smoker -- 1.0 packs/day for 32 years    Types: Cigarettes    Quit date: 10/09/2002  . Smokeless tobacco: Never Used  . Alcohol Use: Yes     2 drinks a day 2-3 times per week.  . Drug Use: No  . Sexually Active: Not on file   Other Topics Concern  . Not on file   Social History Narrative  . No narrative on file    Review of Systems: Pertinent positive and negative review of systems were noted in the above HPI section.  All other review of systems was otherwise negative.  Physical Exam: Vital signs in last 24 hours: Temp:  [98 F (36.7 C)] 98 F (36.7 C) (08/05 1046) Pulse Rate:  [75-81] 77  (08/05 1615) Resp:  [19-20] 19  (08/05 1421) BP: (99-133)/(55-76) 108/59 mmHg (08/05 1615) SpO2:  [98 %-100 %] 99 % (08/05 1615) Weight:  [167 lb 12.3 oz (76.1 kg)-170 lb (77.111 kg)] 167 lb 12.3 oz (76.1 kg) (08/05 1615)   General:   Alert,  Well-developed, well-nourished, pleasant and cooperative in NAD,pale Head:  Normocephalic and atraumatic. Eyes:  Sclera clear, no icterus.   Conjunctiva pale Ears:  Normal auditory acuity. Nose:  No deformity, discharge,  or lesions. Mouth:  No deformity or lesions.   Neck:  Supple; no masses or thyromegaly. Lungs:  Clear throughout to auscultation.   No wheezes, crackles, or rhonchi. Heart:  Regular rate and rhythm; no murmurs, clicks, rubs,  or gallops. Abdomen:  Soft,nontender, BS active,nonpalp mass or hsm.   Rectal:   Deferred -done in ER black heme + stool Msk:  Symmetrical without gross deformities. . Pulses:  Normal pulses noted. Extremities:  Without clubbing or edema. Neurologic:  Alert and  oriented x4;  grossly normal neurologically. Skin:  Intact without significant lesions or rashes.. Psych:  Alert and  cooperative. Normal mood and affect.  Intake/Output from previous day:   Intake/Output this shift:    Lab Results:  Basename 05/13/12 1230 05/13/12 1220  WBC -- 9.6  HGB 8.2* 7.9*  HCT 24.0* 24.3*  PLT -- 209   BMET  Basename 05/13/12 1230  NA 141  K 3.9  CL 107  CO2 --  GLUCOSE 95  BUN 9  CREATININE 0.90  CALCIUM --   LFT No results found for this basename: PROT,ALBUMIN,AST,ALT,ALKPHOS,BILITOT,BILIDIR,IBILI in the last 72 hours PT/INR  Basename 05/13/12 1645  LABPROT 14.0  INR 1.06     Studies/Results: Dg Abd Portable 1v  05/13/2012  *RADIOLOGY REPORT*  Clinical Data: GI bleed  PORTABLE ABDOMEN - 1 VIEW  Comparison: None.  Findings: Scattered air and stool throughout the bowel.  Negative for obstruction or dilatation.  No ileus.  Streaky left lower lobe retrocardiac atelectasis.  Mild degenerative changes of the lower thoracic spine.  No acute osseous finding or abnormal calcification.  IMPRESSION: Nonobstructive bowel gas pattern.  Original Report Authenticated By: Judie Petit. Ruel Favors, M.D.    IMPRESSION:  #48 61 year old male with acute/subacute upper GI bleed in the setting of chronic aspirin and Plavix use. Patient symptomatic and has had a syncopal episode. Suspect peptic ulcer disease  #2 history of coronary artery disease status post MI 2004, status post bare-metal stent #3  Hyperlipidemia   PLAN: #1 Patient has been placed on a PPI infusion. #2 Will transfuse to keep his hemoglobin in the 9-10 range #3 hold aspirin and Plavix #4 EGD tomorrow or Wednesday to identifysource of bleeding though endoscopic therapy is limited to Endo Clipping until Plavix washes out of  his system.-as long as he is stable will wait until wednesday We will follow closely with you   Amy Esterwood  05/13/2012, 5:31 PM  I have reviewed the above note, examined the patient and agree with plan of treatment. Acute large volume UGI bleed associated with Plavix and ASA 325 mg x 8 years after a bare metal stent placed by Dr Deborah Chalk. He is now hemodynamically stable, last B.M. 8 hours ago. I suspect gastric or duodenal ulcer. As long as he remains stable I would prefer to wait before EGD to let the Plavix effect to taper.off ,I agree with bowl rest and PPI drip and frequent H/H. Will check for H.Pylori. Dr Sw[place]il[place], his cardiologist will make a decision once he is discharged as to long term benefit of Plavix.

## 2012-05-13 NOTE — H&P (Addendum)
[facility]                                                                                    Patient Demographics  Ricardo Arnold, is a 61 y.o. male  CSN: [id number]  MRN: [id number]  DOB - [birth year]  Admit Date - 05/13/2012  Outpatient Primary MD for the patient is No primary provider on file.   With History of -  Past Medical History  Diagnosis Date  . Ischemic heart disease   . Hyperlipidemia   . History of acute lateral wall MI 2004    treated with a 3.0 x 18 mm Cypher stent/   . Hypokinesia     with EF of 49%  /by  cardiolite study  . Coronary atherosclerosis   . Anxiety       Past Surgical History  Procedure Date  . Cardiac catheterization 03/09/2003    EF  50%   . Cardiac stents     2004  . Tonsillectomy     in for   Chief Complaint  Patient presents with  . Melena     HPI  Ricardo Arnold  is a 61 y.o. male,with H/O CAD- bare metal sten >8 yrs ago, on ASA-Plavix, comes from [facility] ER presenting with 3-4 day H/O melena, fatigue and weakness-generalized, In Er Hb 8, haem +ve, denies Abd pain, no N&V, no diarrhea, no scopes ever before, no GI History. No Chest pain, no palpitations, no SOB at rest.   Review of Systems    In addition to the HPI above,   No Fever-chills, No Headache, No changes with Vision or hearing, No problems swallowing food or Liquids, No Chest pain, Cough or Shortness of Breath, No Abdominal pain, No Nausea or Vommitting, Bowel movements are regular, No Blood in stool or Urine, but black stools No dysuria, No new skin rashes or bruises, No new joints pains-aches,  No new weakness, tingling, numbness in any extremity, generalized weakness No recent weight gain or loss, No polyuria, polydypsia or polyphagia, No significant Mental Stressors.  A full 10 point Review of Systems was done, except as stated above, all other Review of Systems were negative.   Social History History  Substance Use Topics  . Smoking  status: Former Smoker -- 1.0 packs/day for 32 years    Types: Cigarettes    Quit date: 10/09/2002  . Smokeless tobacco: Never Used  . Alcohol Use: Yes     2 drinks a day 2-3 times per week.     Family History Family History  Problem Relation Age of Onset  . Coronary artery disease Father     had bypass in his 27's  . Heart disease Father   . Heart attack Father      Prior to Admission medications   Medication Sig Start Date End Date Taking? Authorizing Provider  aspirin 325 MG buffered tablet Take 325 mg by mouth daily.   Yes Historical Provider, MD  atorvastatin (LIPITOR) 40 MG tablet Take 40 mg by mouth daily. 01/16/12 01/15/13 Yes Peter M Swaziland, MD  clopidogrel (PLAVIX) 75 MG tablet Take 75 mg by mouth daily.   Yes Historical  Provider, MD  Multiple Vitamins-Minerals (CENTRUM SILVER PO) Take 1 tablet by mouth daily.   Yes Historical Provider, MD  aspirin 81 MG tablet Take 325 mg by mouth daily.     Historical Provider, MD    No Known Allergies  Physical Exam  Vitals  Blood pressure 99/55, pulse 75, temperature 98 F (36.7 C), temperature source Oral, resp. rate 19, height 6' (1.829 m), weight 77.111 kg (170 lb), SpO2 98.00%.   1. General pale appearing middle aged white male lying in bed in NAD,     2. Normal affect and insight, Not Suicidal or Homicidal, Awake Alert, Oriented X 3.  3. No F.N deficits, ALL C.Nerves Intact, Strength 5/5 all 4 extremities, Sensation intact all 4 extremities, Plantars down going.  4. Ears and Eyes appear Normal, Conjunctivae clear, PERRLA. Moist Oral Mucosa.  5. Supple Neck, No JVD, No cervical lymphadenopathy appriciated, No Carotid Bruits.  6. Symmetrical Chest wall movement, Good air movement bilaterally, CTAB.  7. RRR, No Gallops, Rubs or Murmurs, No Parasternal Heave.  8. Positive Bowel Sounds, Abdomen Soft, Non tender, No organomegaly appriciated,No rebound -guarding or rigidity.  9.  No Cyanosis, Normal Skin Turgor, No Skin  Rash or Bruise.  10. Good muscle tone,  joints appear normal , no effusions, Normal ROM.  11. No Palpable Lymph Nodes in Neck or Axillae     Data Review  CBC  Lab 05/13/12 1230 05/13/12 1220  WBC -- 9.6  HGB 8.2* 7.9*  HCT 24.0* 24.3*  PLT -- 209  MCV -- 91.7  MCH -- 29.8  MCHC -- 32.5  RDW -- 15.1  LYMPHSABS -- 2.0  MONOABS -- 0.8  EOSABS -- 0.1  BASOSABS -- 0.0  BANDABS -- --   ------------------------------------------------------------------------------------------------------------------  Chemistries   Lab 05/13/12 1230  NA 141  K 3.9  CL 107  CO2 --  GLUCOSE 95  BUN 9  CREATININE 0.90  CALCIUM --  MG --  AST --  ALT --  ALKPHOS --  BILITOT --   ------------------------------------------------------------------------------------------------------------------ estimated creatinine clearance is 96.4 ml/min (by C-G formula based on Cr of 0.9). ------------------------------------------------------------------------------------------------------------------ No results found for this basename: TSH,T4TOTAL,FREET3,T3FREE,THYROIDAB in the last 72 hours   Coagulation profile No results found for this basename: INR:5,PROTIME:5 in the last 168 hours ------------------------------------------------------------------------------------------------------------------- No results found for this basename: DDIMER:2 in the last 72 hours -------------------------------------------------------------------------------------------------------------------  Cardiac Enzymes No results found for this basename: CK:3,CKMB:3,TROPONINI:3,MYOGLOBIN:3 in the last 168 hours ------------------------------------------------------------------------------------------------------------------ No components found with this basename: POCBNP:3   ---------------------------------------------------------------------------------------------------------------  Urinalysis No results found for  this basename: colorurine, appearanceur, labspec, phurine, glucoseu, hgbur, bilirubinur, ketonesur, proteinur, urobilinogen, nitrite, leukocytesur      Imaging results:   No results found.  My personal review of EKG: Rhythm NSR, Rate  63 /min,  no Acute ST changes     Assessment & Plan     1.UGI bleed causing melena , anemia, fatigue - admit to step down, 2 units PRBCs as has symptoms and likely still bleeding, IV PPI drip, hold ASA-Plavix, monitor H&H, NPO, check KUB, GI called.    2.CAD- drug alluding stents > 8 yrs, no acute issues, pain free, EKG stable, hold ASA-Plavix.    3.Anemia due to UGI Bleed - as #1, check An. Panel.    4.Dyslipidemia - on home dose statin.    DVT Prophylaxis   SCDs    AM Labs Ordered, also please review Full Orders  Family Communication: Admission, patients condition and plan of care  including tests being ordered have been discussed with the patient who indicates understanding and agree with the plan and Code Status.  Code Status full  Disposition Plan: Home  Time spent in minutes : 45  Condition GUARDED   Leroy Sea M.D on 05/13/2012 at 4:45 PM  Between 7am to 7pm - Pager - [phone]  After 7pm go to [email or web address] - password [email or web address]  And look for the night coverage person covering me after hours  [facility] Office  [phone]

## 2012-05-13 NOTE — Telephone Encounter (Signed)
[facility] Call  Ricardo Arnold is a 61 yo male who presented to [facility] this morning after having black stools for several days and a syncopal episode yesterday.    He is on ASA and Plavix.  His cardiologist is Dr. Swaziland.  He has no PCP.  Hgb 7.9, BUN non elevated.    [facility] ED (Dr. Jeraldine Loots) agreed to call unassigned GI for consultation.  They have no blood to transfuse at [facility].  He will go to a step down bed at [facility] for symptomatic anemia.  Algis Downs, PA-C [facility] Pager: [phone]

## 2012-05-13 NOTE — ED Notes (Signed)
Patient states blood in stool noticed after a fall and passing out this weekend after climbing stairs. He has noticed low BP all weekend. Patient states is on aspirin regiment past 10 years.

## 2012-05-13 NOTE — Telephone Encounter (Signed)
[facility] telephone call.  The patient is going to [facility] rather than [facility].  Gastroenterology notified.  Algis Downs, PA-C [facility] Pager: [phone]

## 2012-05-13 NOTE — ED Provider Notes (Signed)
History     CSN: [id number]  Arrival date & time 05/13/12  1035   First MD Initiated Contact with Patient 05/13/12 1053      Chief Complaint  Patient presents with  . Melena     HPI The patient presents with concerns of persistent melena, and one syncopal episode.  He notes that he was in his usual state of health until approximately 4 days ago.  He notes that since that time he has had multiple episodes of black, tarry stool with visible blood.  His bowel movements are painless. 2 days ago, the patient had an episode of syncope after mild exertion.  He recalls climbing a flight of stairs, that he typically accomplishes with no problems.  Immediately after reaching the top the patient was lightheaded, or essentially gone and loss consciousness for a few moments.  He woke with no chest pain, no headache, no confusion. Today he presents with persistent, worsening fatigue, but no dyspnea, chest pain, headache.  Past Medical History  Diagnosis Date  . Ischemic heart disease   . Hyperlipidemia   . History of acute lateral wall MI 2004    treated with a 3.0 x 18 mm Cypher stent/   . Hypokinesia     with EF of 49%  /by  cardiolite study  . Coronary atherosclerosis   . Anxiety     Past Surgical History  Procedure Date  . Cardiac catheterization 03/09/2003    EF  50%   . Cardiac stents     2004  . Tonsillectomy     Family History  Problem Relation Age of Onset  . Coronary artery disease Father     had bypass in his 82's  . Heart disease Father   . Heart attack Father     History  Substance Use Topics  . Smoking status: Former Smoker -- 1.0 packs/day for 32 years    Types: Cigarettes    Quit date: 10/09/2002  . Smokeless tobacco: Never Used  . Alcohol Use: Yes     2 drinks a day 2-3 times per week.      Review of Systems  Constitutional:       Per HPI, otherwise negative  HENT:       Per HPI, otherwise negative  Eyes: Negative.   Respiratory:       Per HPI,  otherwise negative  Cardiovascular:       Per HPI, otherwise negative  Gastrointestinal: Negative for vomiting.  Genitourinary: Negative.   Musculoskeletal:       Per HPI, otherwise negative  Skin: Negative.   Neurological: Negative for syncope.    Allergies  Review of patient's allergies indicates no known allergies.  Home Medications   Current Outpatient Rx  Name Route Sig Dispense Refill  . ASPIRIN BUFFERED 325 MG PO TABS Oral Take 325 mg by mouth daily.    . ATORVASTATIN CALCIUM 40 MG PO TABS Oral Take 40 mg by mouth daily.    Marland Kitchen CLOPIDOGREL BISULFATE 75 MG PO TABS Oral Take 75 mg by mouth daily.    . CENTRUM SILVER PO Oral Take 1 tablet by mouth daily.    . ASPIRIN 81 MG PO TABS Oral Take 325 mg by mouth daily.       BP 133/76  Pulse 81  Temp 98 F (36.7 C) (Oral)  Resp 20  Ht 6' (1.829 m)  Wt 170 lb (77.111 kg)  BMI 23.06 kg/m2  SpO2 100%  Physical Exam  Nursing note and vitals reviewed. Constitutional: He is oriented to person, place, and time. He appears well-developed. No distress.  HENT:  Head: Normocephalic and atraumatic.  Eyes: EOM are normal. Right conjunctiva is not injected. Right conjunctiva has no hemorrhage. Left conjunctiva is not injected. Left conjunctiva has no hemorrhage.       Pale conjunctiva  Cardiovascular: Normal rate and regular rhythm.   Pulmonary/Chest: Effort normal. No stridor. No respiratory distress.  Abdominal: He exhibits no distension.  Genitourinary: Prostate normal. Rectal exam shows no external hemorrhoid, no internal hemorrhoid, no fissure, no mass, no tenderness and anal tone normal. Guaiac positive stool.  Musculoskeletal: He exhibits no edema.  Neurological: He is alert and oriented to person, place, and time.  Skin: Skin is warm and dry. There is pallor.  Psychiatric: He has a normal mood and affect.    ED Course  Procedures (including critical care time)  Labs Reviewed  CBC WITH DIFFERENTIAL - Abnormal; Notable for  the following:    RBC 2.65 (*)     Hemoglobin 7.9 (*)     HCT 24.3 (*)     All other components within normal limits  POCT I-STAT, CHEM 8 - Abnormal; Notable for the following:    Calcium, Ion 1.27 (*)     Hemoglobin 8.2 (*)     HCT 24.0 (*)     All other components within normal limits  OCCULT BLOOD X 1 CARD TO LAB, STOOL   No results found.   No diagnosis found.   Cardiac 65 sinus rhythm normal Pulse ox 100% room air normal    Date: 05/13/2012  Rate: 63  Rhythm: normal sinus rhythm  QRS Axis: normal  Intervals: normal  ST/T Wave abnormalities: normal  Conduction Disutrbances:low voltage  Narrative Interpretation:   Old EKG Reviewed: none available  Re-eval: patient c/o significant anxiety over his condition.  Ativan ordered.  Eval: patient informed of results, he agrees with the plan to transfer to [facility] for further E/M.  MDM  This previously well male presents after a syncopal episode that occurred during several days of black stool.  Notably, the patient had a syncopal episode after a minimally exertional, previously normal, activity.  On my exam the patient is in no distress, though he looks pale listless.  The patient is mentating well, his vital signs are reassuring.  Given the patient's description of several days of black stool there was immediate cessation of GI bleed.  The patient's Hemoccult test is positive, grossly he had black stool.  The patient's hemoglobin was less than 8, and given his description of a syncopal events, the persistent listlessness he will require transfusion, GI consult today.  I discussed the case with both the gastroenterologist on call, Dr. Arlyce Dice, and the admitting service at [facility].  At this facility there are only 2 units of emergency red blood cells.  As the patient is in no distress resting, he will not be given these units.  The patient will be transferred via monitored care.  CRITICAL CARE Performed by: Gerhard Munch   Total critical care time: 35  Critical care time was exclusive of separately billable procedures and treating other patients.  Critical care was necessary to treat or prevent imminent or life-threatening deterioration.  Critical care was time spent personally by me on the following activities: development of treatment plan with patient and/or surrogate as well as nursing, discussions with consultants, evaluation of patient's response to treatment, examination of patient, obtaining history  from patient or surrogate, ordering and performing treatments and interventions, ordering and review of laboratory studies, ordering and review of radiographic studies, pulse oximetry and re-evaluation of patient's condition.   Gerhard Munch, MD 05/13/12 1446

## 2012-05-13 NOTE — ED Notes (Signed)
Patient states 2 days ago he walked up two flights of stairs and he felt light headed and fainted after getting to the top.  States he drank some water and felt better.  Over the weekend he has had several black tarry stools each day.  Takes one 325mg  aspirin daily for the last 10 years.

## 2012-05-13 NOTE — ED Notes (Signed)
Patient states he noticed dark tarry stools Saturday, 2 BM per day and felt weak

## 2012-05-14 LAB — IRON AND TIBC
Iron: <10 ug/dL — ABNORMAL LOW (ref 42–135)
UIBC: 316 ug/dL (ref 125–400)

## 2012-05-14 LAB — TYPE AND SCREEN

## 2012-05-14 LAB — CBC
HCT: 26.8 % — ABNORMAL LOW (ref 39.0–52.0)
MCH: 29.8 pg (ref 26.0–34.0)
MCV: 88.7 fL (ref 78.0–100.0)
Platelets: 177 10*3/uL (ref 150–400)
RBC: 3.02 MIL/uL — ABNORMAL LOW (ref 4.22–5.81)

## 2012-05-14 LAB — FERRITIN: Ferritin: 4 ng/mL — ABNORMAL LOW (ref 22–322)

## 2012-05-14 LAB — BASIC METABOLIC PANEL
CO2: 24 mEq/L (ref 19–32)
Calcium: 8.1 mg/dL — ABNORMAL LOW (ref 8.4–10.5)
Chloride: 110 mEq/L (ref 96–112)
Glucose, Bld: 91 mg/dL (ref 70–99)
Sodium: 141 mEq/L (ref 135–145)

## 2012-05-14 LAB — HEMOGLOBIN AND HEMATOCRIT, BLOOD: Hemoglobin: 9.3 g/dL — ABNORMAL LOW (ref 13.0–17.0)

## 2012-05-14 MED ORDER — SODIUM CHLORIDE 0.9 % IV SOLN: INTRAVENOUS | Status: DC

## 2012-05-14 MED ORDER — IRON DEXTRAN 50 MG/ML IJ SOLN
25.0000 mg | Freq: Once | INTRAMUSCULAR | Status: AC
  Administered 2012-05-14: 25 mg via INTRAVENOUS
  Filled 2012-05-14: qty 0.5

## 2012-05-14 MED ORDER — SODIUM CHLORIDE 0.9 % IV SOLN
1500.0000 mg | Freq: Once | INTRAVENOUS | Status: AC
  Administered 2012-05-14: 1500 mg via INTRAVENOUS
  Filled 2012-05-14: qty 30

## 2012-05-14 MED ORDER — FERROUS SULFATE 300 (60 FE) MG/5ML PO SYRP
300.0000 mg | ORAL_SOLUTION | Freq: Three times a day (TID) | ORAL | Status: DC
Start: 2012-05-14 — End: 2012-05-16
  Administered 2012-05-14 – 2012-05-16 (×5): 300 mg via ORAL
  Filled 2012-05-14 (×10): qty 5

## 2012-05-14 MED ORDER — SENNOSIDES 8.8 MG/5ML PO SYRP
10.0000 mL | ORAL_SOLUTION | Freq: Every day | ORAL | Status: DC
  Administered 2012-05-15: 10 mL via ORAL
  Filled 2012-05-14 (×4): qty 10

## 2012-05-14 MED ORDER — SODIUM CHLORIDE 0.9 % IV SOLN: INTRAVENOUS | Status: AC

## 2012-05-14 NOTE — Progress Notes (Signed)
CARE MANAGEMENT NOTE 05/14/2012  Patient:  Ricardo Arnold, CAREW   Account Number:  [id number]  Date Initiated:  05/14/2012  Documentation initiated by:  DAVIS,RHONDA  Subjective/Objective Assessment:   paptient with hx of gi bleed , found to have recurrant gi bld with melana hypotension and requiring volume support,     Action/Plan:   from home   Anticipated DC Date:  05/15/2012   Anticipated DC Plan:  HOME/SELF CARE  In-house referral  NA      DC Planning Services  NA      PAC Choice  NA   Choice offered to / List presented to:  NA   DME arranged  NA      DME agency  NA     HH arranged  NA      HH agency  NA   Status of service:  In process, will continue to follow Medicare Important Message given?  NA - LOS <3 / Initial given by admissions (If response is "NO", the following Medicare IM given date fields will be blank) Date Medicare IM given:   Date Additional Medicare IM given:    Discharge Disposition:    Per UR Regulation:  Reviewed for med. necessity/level of care/duration of stay  If discussed at [facility] Length of Stay Meetings, dates discussed:    Comments:  [id number] Earlene Plater, RN, BSN, CCM: CHART REVIEWED AND UPDATED. NO DISCHARGE NEEDS PRESENT AT THIS TIME. CASE MANAGEMENT [phone]

## 2012-05-14 NOTE — Progress Notes (Signed)
Pharmacy Consult - IV Iron  61 YOM admitted with UGI bleed associated with Plavix/ASA.  GI on board, plan EGD to identify source of bleeding when Plavix effect wears off.  Patient with anemia 2/2 GI bleed.  Iron panel 8/5 showed Iron <10, Ferritin = 4.  H/H this morning = 9/26.8.  MD ordered for pharmacy to dose IV iron.   Patient with no allergies, will proceed with IV Iron Dextran.  Pt wts 76.4 kg (ABW < IBW), no goal Hgb provided by MD, will aim for goal Hgb of 12 for iron dose calculation.   Total Iron deficit = 1500 mg   Plan:   Iron dextran 25 mg test dose IV x 1 over 5 minutes.    If no reaction with test dose after 1 hour, will give Iron Dextran 1500 mg IV x 1 dose (infuse over 4 hours)  Patient not receiving any PO meds at the moment, will order Ferrous sulfate 300 mg/5 mL (60 mg elemental iron) TID with meals and Senna Syrup 8.8 mg/5 mL 10 mL QHS (hold for diarrhea) per protocol.  Geoffry Paradise, PharmD, BCPS Pager: [phone] 8:55 AM

## 2012-05-14 NOTE — Progress Notes (Signed)
[facility]                                                                                Patient Demographics  Ricardo Arnold, is a 61 y.o. male  JYN:[id number]  QMV:[id number]  DOB - 1950-10-16  Admit date - 05/13/2012  Admitting Physician Leroy Sea, MD  Outpatient Primary MD for the patient is No primary provider on file.  LOS - 1   Chief Complaint  Patient presents with  . Melena        Assessment & Plan    1.UGI bleed causing melena , anemia, fatigue - post 2 units of packed RBC transfusion, H&H is stable, patient feels better, continue IV PPI, continue holding off aspirin and Plavix, seen by GI likely for EGD later today. Will move off of step down as patient hemodynamically stable. Continue to monitor H&H closely   2.CAD- drug alluding stents > 8 yrs, no acute issues, pain free, EKG stable, hold ASA-Plavix.    3.Anemia due to UGI Bleed - anemia panel suggestive of severe iron, will consult pharmacy to dose and dispense IV iron.    4.Dyslipidemia - on home dose statin.      Code Status: Full  Family Communication: Discussed with the patient  Disposition Plan: Home    Procedures EGD scheduled    Consults  GI   Antibiotics  none   Time Spent in minutes   30   DVT Prophylaxis   SCDs     Susa Raring K M.D on 05/14/2012 at 8:31 AM  Between 7am to 7pm - Pager - [phone]  After 7pm go to [email or web address] - password [email or web address]  And look for the night coverage person covering for me after hours  [facility] Office  [phone]    Subjective:   [name] today has, No headache, No chest pain, No abdominal pain - No Nausea, No new weakness tingling or numbness, No Cough - SOB.    Objective:   Filed Vitals:   05/14/12 0500 05/14/12 0600 05/14/12 0700 05/14/12 0800  BP: 91/48 85/49 97/53  94/51  Pulse: 59 57 66 64  Temp:    98.8 F (37.1 C)  TempSrc:    Oral  Resp: 17 11 15 17   Height:      Weight:  76.4 kg (168 lb 6.9 oz)     SpO2: 96% 95% 96% 94%    Wt Readings from Last 3 Encounters:  05/14/12 76.4 kg (168 lb 6.9 oz)  01/03/12 80.287 kg (177 lb)  03/14/11 76.204 kg (168 lb)     Intake/Output Summary (Last 24 hours) at 05/14/12 0831 Last data filed at 05/14/12 0700  Gross per 24 hour  Intake 2993.83 ml  Output   1100 ml  Net 1893.83 ml    Exam Awake Alert, Oriented X 3, No new F.N deficits, Normal affect [place].AT,PERRAL Supple Neck,No JVD, No cervical lymphadenopathy appriciated.  Symmetrical Chest wall movement, Good air movement bilaterally, CTAB RRR,No Gallops,Rubs or new Murmurs, No Parasternal Heave +ve B.Sounds, Abd Soft, Non tender, No organomegaly appriciated, No rebound - guarding or rigidity. No Cyanosis, Clubbing or edema, No new Rash or bruise  Data Review   CBC  Lab 05/14/12 0252 05/13/12 1230 05/13/12 1220  WBC 6.9 -- 9.6  HGB 9.0* 8.2* 7.9*  HCT 26.8* 24.0* 24.3*  PLT 177 -- 209  MCV 88.7 -- 91.7  MCH 29.8 -- 29.8  MCHC 33.6 -- 32.5  RDW 15.6* -- 15.1  LYMPHSABS -- -- 2.0  MONOABS -- -- 0.8  EOSABS -- -- 0.1  BASOSABS -- -- 0.0  BANDABS -- -- --    Chemistries   Lab 05/14/12 0252 05/13/12 1230  NA 141 141  K 3.7 3.9  CL 110 107  CO2 24 --  GLUCOSE 91 95  BUN 8 9  CREATININE 0.84 0.90  CALCIUM 8.1* --  MG -- --  AST -- --  ALT -- --  ALKPHOS -- --  BILITOT -- --   ------------------------------------------------------------------------------------------------------------------ estimated creatinine clearance is 102.3 ml/min (by C-G formula based on Cr of 0.84). ------------------------------------------------------------------------------------------------------------------ No results found for this basename: HGBA1C:2 in the last 72 hours ------------------------------------------------------------------------------------------------------------------ No results found for this basename:  CHOL:2,HDL:2,LDLCALC:2,TRIG:2,CHOLHDL:2,LDLDIRECT:2 in the last 72 hours ------------------------------------------------------------------------------------------------------------------ No results found for this basename: TSH,T4TOTAL,FREET3,T3FREE,THYROIDAB in the last 72 hours ------------------------------------------------------------------------------------------------------------------  [facility] 05/13/12 1650 05/13/12 1645  VITAMINB12 462 --  FOLATE -- >20.0  FERRITIN -- 4*  TIBC -- Not calculated due to Iron <10.  IRON -- <10*  RETICCTPCT -- 4.2*    Coagulation profile  Lab 05/13/12 1645  INR 1.06  PROTIME --    No results found for this basename: DDIMER:2 in the last 72 hours  Cardiac Enzymes No results found for this basename: CK:3,CKMB:3,TROPONINI:3,MYOGLOBIN:3 in the last 168 hours ------------------------------------------------------------------------------------------------------------------ No components found with this basename: POCBNP:3  Micro Results Recent Results (from the past 240 hour(s))  MRSA PCR SCREENING     Status: Normal   Collection Time   05/13/12  4:29 PM      Component Value Range Status Comment   MRSA by PCR NEGATIVE  NEGATIVE Final     Radiology Reports Dg Abd Portable 1v  05/13/2012  *RADIOLOGY REPORT*  Clinical Data: GI bleed  PORTABLE ABDOMEN - 1 VIEW  Comparison: None.  Findings: Scattered air and stool throughout the bowel.  Negative for obstruction or dilatation.  No ileus.  Streaky left lower lobe retrocardiac atelectasis.  Mild degenerative changes of the lower thoracic spine.  No acute osseous finding or abnormal calcification.  IMPRESSION: Nonobstructive bowel gas pattern.  Original Report Authenticated By: Judie Petit. Ruel Favors, M.D.    Scheduled Meds:   . LORazepam  0.5 mg Intravenous Once  . pantoprazole (PROTONIX) IV  80 mg Intravenous Once  . sodium chloride  1,000 mL Intravenous Once  . sodium chloride  3 mL Intravenous Q12H  .  DISCONTD: sodium chloride   Intravenous STAT   Continuous Infusions:   . sodium chloride    . pantoprozole (PROTONIX) infusion 8 mg/hr (05/14/12 0700)  . DISCONTD: sodium chloride 125 mL/hr (05/14/12 0630)   PRN Meds:.albuterol, guaiFENesin-dextromethorphan, HYDROcodone-acetaminophen, LORazepam, ondansetron (ZOFRAN) IV, ondansetron

## 2012-05-14 NOTE — Progress Notes (Signed)
Patient ID: Ricardo Arnold, male   DOB: Feb 12, 1951, 61 y.o.   MRN: [id number] [facility] Progress Note  Subjective: Feels better, still weak. No Bm's since admit. Hgb 9.3 this am  Objective:  Vital signs in last 24 hours: Temp:  [97.6 F (36.4 C)-98.8 F (37.1 C)] 98.8 F (37.1 C) (08/06 0800) Pulse Rate:  [57-77] 67  (08/06 0900) Resp:  [11-20] 15  (08/06 0900) BP: (84-111)/(39-72) 99/58 mmHg (08/06 0900) SpO2:  [94 %-100 %] 99 % (08/06 0900) Weight:  [167 lb 12.3 oz (76.1 kg)-168 lb 6.9 oz (76.4 kg)] 168 lb 6.9 oz (76.4 kg) (08/06 0500) Last BM Date: 05/13/12 General:   Alert,  Well-developed,    in NAD Heart:  Regular rate and rhythm; no murmurs Pulm;clear Abdomen:  Soft, nontender and nondistended. Normal bowel sounds, without guarding Extremities:  Without edema. Neurologic:  Alert and  oriented x4;  grossly normal neurologically. Psych:  Alert and cooperative. Normal mood and affect.  Intake/Output from previous day: 08/05 0701 - 08/06 0700 In: 2993.8 [I.V.:2225.8; Blood:768] Out: 1100 [Urine:1100] Intake/Output this shift: Total I/O In: 1050 [P.O.:600; I.V.:400; IV Piggyback:50] Out: 550 [Urine:550]  Lab Results:  Basename 05/14/12 0940 05/14/12 0252 05/13/12 1230 05/13/12 1220  WBC -- 6.9 -- 9.6  HGB 9.3* 9.0* 8.2* --  HCT 28.2* 26.8* 24.0* --  PLT -- 177 -- 209   BMET  Basename 05/14/12 0252 05/13/12 1230  NA 141 141  K 3.7 3.9  CL 110 107  CO2 24 --  GLUCOSE 91 95  BUN 8 9  CREATININE 0.84 0.90  CALCIUM 8.1* --   LFT No results found for this basename: PROT,ALBUMIN,AST,ALT,ALKPHOS,BILITOT,BILIDIR,IBILI in the last 72 hours PT/INR  Basename 05/13/12 1645  LABPROT 14.0  INR 1.06    Assessment / Plan: 61 yo male with acute upper  GI bleed with syncope and weakness in setting of CAD, on asa and plavix. Stable this am post transfusions ASA and Plavix on hold Clear liquid diet, IV PPI Scheduled for EGD in am with Dr. Juanda Chance( will be  3 days off plavix) #2 severe fe deficiency- receiving IV Iron today He will need colonoscopy as well, timing will depend on what is found on EGD Principal Problem:  *UGI bleed Active Problems:  Ischemic heart disease  Hyperlipidemia  History of acute lateral wall MI  Anemia, blood loss     LOS: 1 day   Amy Esterwood  05/14/2012, 11:48 AM I have reviewed the above note, examined the patient and agree with plan of treatment. Hgb  Stable,, no abd. pain, no stools. Will proceed with EGD tomorrow

## 2012-05-15 ENCOUNTER — Encounter (HOSPITAL_COMMUNITY)
Admission: EM | Disposition: A | Discharge status: Home / Self Care | Payer: Self-pay | Source: Home / Self Care | Attending: Family Medicine

## 2012-05-15 ENCOUNTER — Encounter (HOSPITAL_COMMUNITY): Payer: Self-pay

## 2012-05-15 DIAGNOSIS — K254 Chronic or unspecified gastric ulcer with hemorrhage: Secondary | ICD-10-CM | POA: Diagnosis present

## 2012-05-15 HISTORY — PX: ESOPHAGOGASTRODUODENOSCOPY: SHX5428

## 2012-05-15 LAB — BASIC METABOLIC PANEL
CO2: 28 mEq/L (ref 19–32)
Calcium: 8.8 mg/dL (ref 8.4–10.5)
Glucose, Bld: 89 mg/dL (ref 70–99)
Potassium: 3.6 mEq/L (ref 3.5–5.1)
Sodium: 140 mEq/L (ref 135–145)

## 2012-05-15 LAB — CBC
HCT: 27.1 % — ABNORMAL LOW (ref 39.0–52.0)
Hemoglobin: 8.9 g/dL — ABNORMAL LOW (ref 13.0–17.0)
MCH: 29.3 pg (ref 26.0–34.0)
RBC: 3.04 MIL/uL — ABNORMAL LOW (ref 4.22–5.81)

## 2012-05-15 SURGERY — EGD (ESOPHAGOGASTRODUODENOSCOPY)
Anesthesia: Moderate Sedation

## 2012-05-15 MED ORDER — MORPHINE SULFATE 2 MG/ML IJ SOLN
0.5000 mg | Freq: Once | INTRAMUSCULAR | Status: AC
  Administered 2012-05-15: 0.5 mg via INTRAVENOUS
  Filled 2012-05-15: qty 1

## 2012-05-15 MED ORDER — MIDAZOLAM HCL 10 MG/2ML IJ SOLN
INTRAMUSCULAR | Status: DC | PRN
  Administered 2012-05-15 (×4): 2 mg via INTRAVENOUS

## 2012-05-15 MED ORDER — MIDAZOLAM HCL 10 MG/2ML IJ SOLN
INTRAMUSCULAR | Status: AC
  Filled 2012-05-15: qty 2

## 2012-05-15 MED ORDER — FENTANYL CITRATE 0.05 MG/ML IJ SOLN
INTRAMUSCULAR | Status: DC | PRN
  Administered 2012-05-15 (×4): 25 ug via INTRAVENOUS

## 2012-05-15 MED ORDER — SODIUM CHLORIDE 0.9 % IV SOLN: INTRAVENOUS | Status: DC

## 2012-05-15 MED ORDER — FENTANYL CITRATE 0.05 MG/ML IJ SOLN
INTRAMUSCULAR | Status: AC
  Filled 2012-05-15: qty 2

## 2012-05-15 NOTE — Progress Notes (Signed)
Patient ID: Ricardo Arnold, male   DOB: 1951/08/20, 61 y.o.   MRN: [id number] [facility] Progress Note  Subjective: Feels Ok, nervous.. No BM's. No c/o pain  Objective:  Vital signs in last 24 hours: Temp:  [97.7 F (36.5 C)-98.3 F (36.8 C)] 97.8 F (36.6 C) (08/07 0500) Pulse Rate:  [59-68] 62  (08/07 0500) Resp:  [17-18] 17  (08/07 0500) BP: (97-121)/(58-78) 100/65 mmHg (08/07 0500) SpO2:  [96 %-100 %] 98 % (08/07 0500) Weight:  [166 lb 8 oz (75.524 kg)] 166 lb 8 oz (75.524 kg) (08/07 0500) Last BM Date: 05/13/12 General:   Alert,  Well-developed,    in NAD Heart:  Regular rate and rhythm; no murmurs Pulm;clear Abdomen:  Soft, nontender and nondistended. Normal bowel sounds, .   Extremities:  Without edema. Neurologic:  Alert and  oriented x4;  grossly normal neurologically. Psych:  Alert and cooperative. Normal mood and affect.  Intake/Output from previous day: 08/06 0701 - 08/07 0700 In: 2987 [P.O.:1080; I.V.:1325; IV Piggyback:582] Out: 2000 [Urine:2000] Intake/Output this shift:    Lab Results:  Basename 05/15/12 0427 05/14/12 2100 05/14/12 0940 05/14/12 0252 05/13/12 1220  WBC 5.5 -- -- 6.9 9.6  HGB 8.9* 9.3* 9.3* -- --  HCT 27.1* 27.9* 28.2* -- --  PLT 216 -- -- 177 209   BMET  Basename 05/15/12 0427 05/14/12 0252 05/13/12 1230  NA 140 141 141  K 3.6 3.7 3.9  CL 108 110 107  CO2 28 24 --  GLUCOSE 89 91 95  BUN 6 8 9   CREATININE 0.87 0.84 0.90  CALCIUM 8.8 8.1* --   LFT No results found for this basename: PROT,ALBUMIN,AST,ALT,ALKPHOS,BILITOT,BILIDIR,IBILI in the last 72 hours PT/INR  Basename 05/13/12 1645  LABPROT 14.0  INR 1.06   l Assessment / Plan: #1 61 yo male with acute Gi bleed in setting of ASA and Plavix. He has been stable since admit with no active bleeding. For EGD today #2 anemia- secondary to acute blood loss- mild drift, overall stable #3 Fe deficiency anemia- He will need colonoscopy - timing dependent on whether  source for blood loss found today. #4 CAD- s/p stents 2004  Principal Problem:  *UGI bleed Active Problems:  Ischemic heart disease  Hyperlipidemia  History of acute lateral wall MI  Anemia, blood loss     LOS: 2 days   Amy Esterwood  05/15/2012,   I have reviewed the above note, examined the patient and agree with plan of treatment.  [facility] Pager # [phone]

## 2012-05-15 NOTE — Progress Notes (Signed)
Upper endoscopy,  see Procedure note under Procedures  Results : Multiple antral erosions and one superficial ulceration at the incisura, s/p biopses for H.pylori, no evidence of recent bleeding, likely caused by ASA 325 mg qd ( and Plavix)  Plan : advance diet, Follow H/H.  Keep off Plavix x 2 weeks, then reconsider restarting anticoagulants if indicated for long term Rx. Pt needs to check with his cardiologist before starting Plavix Reduce ASA to 81 mg, coated ASA Advance diet  home soon

## 2012-05-15 NOTE — Progress Notes (Signed)
Patient received back in room from endo. Patient in stable condition. Alert and oriented. Erskin Burnet RN

## 2012-05-15 NOTE — Care Management Note (Signed)
    Page 1 of 2   05/16/2012     3:29:54 PM   CARE MANAGEMENT NOTE 05/16/2012  Patient:  Ricardo Arnold, Ricardo Arnold   Account Number:  [id number]  Date Initiated:  05/14/2012  Documentation initiated by:  DAVIS,RHONDA  Subjective/Objective Assessment:   paptient with hx of gi bleed , found to have recurrant gi bld with melana hypotension and requiring volume support,     Action/Plan:   from home   Anticipated DC Date:  05/16/2012   Anticipated DC Plan:  HOME/SELF CARE  In-house referral  NA      DC Planning Services  NA      PAC Choice  NA   Choice offered to / List presented to:  NA   DME arranged  NA      DME agency  NA     HH arranged  NA      HH agency  NA   Status of service:  Completed, signed off Medicare Important Message given?  NA - LOS <3 / Initial given by admissions (If response is "NO", the following Medicare IM given date fields will be blank) Date Medicare IM given:   Date Additional Medicare IM given:    Discharge Disposition:  HOME/SELF CARE  Per UR Regulation:  Reviewed for med. necessity/level of care/duration of stay  If discussed at Long Length of Stay Meetings, dates discussed:    Comments:  05/15/12 [facility] RN,BSN NCM [phone] TRANSERRED FROM ICU TO TELE.  [id number] Earlene Plater, RN, BSN, CCM: CHART REVIEWED AND UPDATED. NO DISCHARGE NEEDS PRESENT AT THIS TIME. CASE MANAGEMENT [phone]

## 2012-05-15 NOTE — Progress Notes (Signed)
[facility] PROGRESS NOTE  Ricardo Arnold ZOX:[id number] DOB: 1951-04-26 DOA: 05/13/2012 PCP: No primary provider on file.  Assessment/Plan: 1. UGIB: EGD report pending. Continue Protonix. 2. ABLA: Hemoglobin stable. No bleeding. Status-post transfusion.  3. History of CAD: Stents >8 years ago. ASA and Plavix on hold.  Code Status: full Family Communication: none at bedside Disposition Plan: home when improved  Brendia Sacks, MD  [facility] Team 4 Pager [phone]. If 8PM-8AM, please contact night-coverage at [email or web address], password [facility] 05/15/2012, 7:11 PM  LOS: 2 days   Brief narrative: 61 year old man on ASA and Plavix presente with melena, fatigue, generalized weakness.  Consultants:  [facility]  Procedures:  EGD 8/7  HPI/Subjective: Feels ok. No bleeding  Objective: Filed Vitals:   05/15/12 1610 05/15/12 1620 05/15/12 1630 05/15/12 1640  BP: 96/53 96/53 91/52  93/54  Pulse:      Temp:      TempSrc:      Resp: 14 13 14 16   Height:      Weight:      SpO2: 99% 98% 96% 93%    Intake/Output Summary (Last 24 hours) at 05/15/12 1911 Last data filed at 05/15/12 1819  Gross per 24 hour  Intake 707.92 ml  Output      0 ml  Net 707.92 ml   Wt Readings from Last 3 Encounters:  05/15/12 75.524 kg (166 lb 8 oz)  05/15/12 75.524 kg (166 lb 8 oz)  05/15/12 75.524 kg (166 lb 8 oz)    Exam:   General:  Appears calm and comforable  Cardiovascular: RRR, no m/r/g, no LE edema  Respiratory: CTA bilaterally, no w/r/r. Normal respiratory effort.  Abdomen: soft, nt, nd  Data Reviewed: Basic Metabolic Panel:  Lab 05/15/12 8295 05/14/12 0252 05/13/12 1230  NA 140 141 141  K 3.6 3.7 3.9  CL 108 110 107  CO2 28 24 --  GLUCOSE 89 91 95  BUN 6 8 9   CREATININE 0.87 0.84 0.90  CALCIUM 8.8 8.1* --  MG -- -- --  PHOS -- -- --   CBC:  Lab 05/15/12 0427 05/14/12 2100 05/14/12 0940 05/14/12 0252 05/13/12 1230 05/13/12 1220  WBC 5.5 -- -- 6.9  -- 9.6  NEUTROABS -- -- -- -- -- 6.8  HGB 8.9* 9.3* 9.3* 9.0* 8.2* --  HCT 27.1* 27.9* 28.2* 26.8* 24.0* --  MCV 89.1 -- -- 88.7 -- 91.7  PLT 216 -- -- 177 -- 209    Recent Results (from the past 240 hour(s))  MRSA PCR SCREENING     Status: Normal   Collection Time   05/13/12  4:29 PM      Component Value Range Status Comment   MRSA by PCR NEGATIVE  NEGATIVE Final     Studies: Dg Abd Portable 1v  05/13/2012  *RADIOLOGY REPORT*  Clinical Data: GI bleed  PORTABLE ABDOMEN - 1 VIEW  Comparison: None.  Findings: Scattered air and stool throughout the bowel.  Negative for obstruction or dilatation.  No ileus.  Streaky left lower lobe retrocardiac atelectasis.  Mild degenerative changes of the lower thoracic spine.  No acute osseous finding or abnormal calcification.  IMPRESSION: Nonobstructive bowel gas pattern.  Original Report Authenticated By: Judie Petit. Ruel Favors, M.D.   Scheduled Meds:   . ferrous sulfate  300 mg Oral TID WC  .  morphine injection  0.5 mg Intravenous Once  . sennosides  10 mL Oral QHS  . sodium chloride  3 mL Intravenous Q12H   Continuous Infusions:   .  sodium chloride 100 mL/hr at 05/14/12 0845  . pantoprozole (PROTONIX) infusion 8 mg/hr (05/15/12 1819)  . DISCONTD: sodium chloride Stopped (05/15/12 1530)    Principal Problem:  *UGI bleed Active Problems:  Ischemic heart disease  Hyperlipidemia  History of acute lateral wall MI  Anemia, blood loss  Gastric ulcer with hemorrhage     Brendia Sacks, MD  [facility] Team 4 Pager [phone]. If 8PM-8AM, please contact night-coverage at [email or web address], password [facility] 05/15/2012, 7:11 PM  LOS: 2 days   Time spent: 20 minutes

## 2012-05-15 NOTE — Interval H&P Note (Signed)
History and Physical Interval Note:  05/15/2012 3:15 PM  Ricardo Arnold  has presented today for surgery, with the diagnosis of Gi bleed  The various methods of treatment have been discussed with the patient and family. After consideration of risks, benefits and other options for treatment, the patient has consented to  Procedure(s) (LRB): ESOPHAGOGASTRODUODENOSCOPY (EGD) (N/A) as a surgical intervention .  The patient's history has been reviewed, patient examined, no change in status, stable for surgery.  I have reviewed the patient's chart and labs.  Questions were answered to the patient's satisfaction.     [name]

## 2012-05-16 ENCOUNTER — Encounter (HOSPITAL_COMMUNITY): Payer: Self-pay | Admitting: Internal Medicine

## 2012-05-16 ENCOUNTER — Encounter (HOSPITAL_COMMUNITY): Payer: Self-pay

## 2012-05-16 ENCOUNTER — Encounter: Payer: Self-pay | Admitting: Internal Medicine

## 2012-05-16 LAB — CBC
HCT: 29 % — ABNORMAL LOW (ref 39.0–52.0)
MCV: 89.8 fL (ref 78.0–100.0)
RBC: 3.23 MIL/uL — ABNORMAL LOW (ref 4.22–5.81)
WBC: 7.4 10*3/uL (ref 4.0–10.5)

## 2012-05-16 MED ORDER — ASPIRIN 81 MG PO TBEC: 81.0000 mg | DELAYED_RELEASE_TABLET | Freq: Every day | ORAL | Status: AC

## 2012-05-16 MED ORDER — PANTOPRAZOLE SODIUM 40 MG PO TBEC: 40.0000 mg | DELAYED_RELEASE_TABLET | Freq: Every day | ORAL | Status: DC

## 2012-05-16 MED ORDER — PANTOPRAZOLE SODIUM 40 MG PO TBEC
40.0000 mg | DELAYED_RELEASE_TABLET | Freq: Every day | ORAL | Status: DC
  Administered 2012-05-16: 40 mg via ORAL
  Filled 2012-05-16: qty 1

## 2012-05-16 MED ORDER — SENNOSIDES 8.8 MG/5ML PO SYRP: 10.0000 mL | ORAL_SOLUTION | Freq: Every day | ORAL | Status: AC

## 2012-05-16 MED ORDER — FERROUS SULFATE 300 (60 FE) MG/5ML PO SYRP: 300.0000 mg | ORAL_SOLUTION | Freq: Three times a day (TID) | ORAL | Status: DC

## 2012-05-16 NOTE — Discharge Summary (Signed)
Physician Discharge Summary  Ricardo Arnold JYN:[id number] DOB: Jan 23, 1951 DOA: 05/13/2012  PCP: No primary provider on file. None Cardiologist: Peter Swaziland, MD Gastroenterologist: [name], MD  Admit date: 05/13/2012 Discharge date: 05/16/2012  Recommendations for Outpatient Follow-up:  1. Follow-up UGIB, antral erosions, outpatient colonoscopy with Dr. Juanda Chance. 2. Follow-up whether patient needs to continue Plavix.   Follow-up Information    Follow up with [name], MD on 07/02/2012. (9:45 AM)    Contact information:   [address] [facility] [address] [place] [address] [place] [address] [phone]       Call Peter Swaziland, MD. (Ask Dr. Swaziland whether you need to continue Plavix long-term)    Contact information:   [address] [place] [address] [phone]         Discharge Diagnoses:  1. UGIB secondary to multiple antral erosions, ASA 325, Plavix 2. ABLA 3. History of CAD  Discharge Condition: Improved Disposition: Home  Diet recommendation: Heart healthy  History of present illness:  61 year old man on ASA and Plavix presente with melena, fatigue, generalized weakness.  Hospital Course:  Ricardo Arnold was admitted for UGIB and seen by gastroenterology. He underwent EGD with findings and conclusions as below. He has had no further bleeding as an inpatient and is now stable for discharge with specific instructions as below. 1. UGIB: Resolved, hemoglobin stable. Likely from multiple antral erosions, likely caused by ASA 325 and Plavix. Per discussion with Dr. Marjorie Smolder daily, iron supplement, keep off Plavix x2 weeks, check with cardiologist before resuming. Take ASA 81mg , coated. Followup 9/24 at 9:45 AM with Dr. Juanda Chance.  2. ABLA: Hemoglobin stable. No bleeding. Status-post transfusion.    3. History of CAD: Stents >8 years ago. Plan as above.  Consultants:  [facility]  Procedures:  EGD 8/7--Multiple antral  erosions and one superficial ulceration at the incisura, s/p biopses for H.pylori, no evidence of recent bleeding, likely caused by ASA 325 mg qd (and Plavix)  PRBC transfusion  Discharge Instructions  Discharge Orders    Future Appointments: Provider: Department: Dept Phone: Center:   07/02/2012 9:45 AM Hart Carwin, MD [facility] [phone] [facility]     Future Orders Please Complete By Expires   Diet - low sodium heart healthy      Discharge instructions      Comments:   Call physician or seek treatment for recurrent bleeding. Do not take Plavix for 2 weeks (not before 8/22). Call Dr. Swaziland to discuss whether you need to be on Plavix long-term.   Activity as tolerated - No restrictions        Medication List  As of 05/16/2012  2:01 PM   STOP taking these medications         aspirin 325 MG buffered tablet      aspirin 81 MG tablet      clopidogrel 75 MG tablet         TAKE these medications         aspirin 81 MG EC tablet   Take 1 tablet (81 mg total) by mouth daily. Swallow whole.      atorvastatin 40 MG tablet   Commonly known as: LIPITOR   Take 40 mg by mouth daily.      CENTRUM SILVER PO   Take 1 tablet by mouth daily.      ferrous sulfate 300 (60 FE) MG/5ML syrup   Take 5 mLs (300 mg total) by mouth 3 (three)  times daily with meals. Iron will turn stool black and can cause constipation. Consider taking a stool softener daily.      pantoprazole 40 MG tablet   Commonly known as: PROTONIX   Take 1 tablet (40 mg total) by mouth daily at 12 noon.      sennosides 8.8 MG/5ML syrup   Commonly known as: SENOKOT   Take 10 mLs by mouth at bedtime.           The results of significant diagnostics from this hospitalization (including imaging, microbiology, ancillary and laboratory) are listed below for reference.    Significant Diagnostic Studies: Dg Abd Portable 1v  05/13/2012  *RADIOLOGY REPORT*  Clinical Data: GI bleed  PORTABLE ABDOMEN - 1 VIEW   Comparison: None.  Findings: Scattered air and stool throughout the bowel.  Negative for obstruction or dilatation.  No ileus.  Streaky left lower lobe retrocardiac atelectasis.  Mild degenerative changes of the lower thoracic spine.  No acute osseous finding or abnormal calcification.  IMPRESSION: Nonobstructive bowel gas pattern.  Original Report Authenticated By: Judie Petit. Ruel Favors, M.D.   Microbiology: Recent Results (from the past 240 hour(s))  MRSA PCR SCREENING     Status: Normal   Collection Time   05/13/12  4:29 PM      Component Value Range Status Comment   MRSA by PCR NEGATIVE  NEGATIVE Final      Labs: Basic Metabolic Panel:  Lab 05/15/12 1610 05/14/12 0252 05/13/12 1230  NA 140 141 141  K 3.6 3.7 3.9  CL 108 110 107  CO2 28 24 --  GLUCOSE 89 91 95  BUN 6 8 9   CREATININE 0.87 0.84 0.90  CALCIUM 8.8 8.1* --  MG -- -- --  PHOS -- -- --   CBC:  Lab 05/16/12 0410 05/15/12 0427 05/14/12 2100 05/14/12 0940 05/14/12 0252 05/13/12 1220  WBC 7.4 5.5 -- -- 6.9 9.6  NEUTROABS -- -- -- -- -- 6.8  HGB 9.6* 8.9* 9.3* 9.3* 9.0* --  HCT 29.0* 27.1* 27.9* 28.2* 26.8* --  MCV 89.8 89.1 -- -- 88.7 91.7  PLT 249 216 -- -- 177 209   Principal Problem:  *UGI bleed Active Problems:  Ischemic heart disease  History of acute lateral wall MI  Anemia, blood loss  Gastric ulcer with hemorrhage   Time coordinating discharge: 25 minutes  Signed:  Brendia Sacks, MD [facility] 05/16/2012, 2:01 PM

## 2012-05-16 NOTE — Op Note (Signed)
[facility] [address] [place], [place]  [address]  ENDOSCOPY PROCEDURE REPORT  PATIENT:  Ricardo Arnold, Ricardo Arnold  MR#:  [id number] BIRTHDATE:  1950/11/17, 61 yrs. old  GENDER:  male  ENDOSCOPIST:  Hedwig Morton. Juanda Chance, MD Referred by:  [facility],  PROCEDURE DATE:  05/15/2012 PROCEDURE:  EGD with biopsy, 43239 ASA CLASS:  Class II INDICATIONS:  melena, iron deficiency anemia pt on Plavix and ASA 325 mg, camein with presyncope and Hgb <8.0  MEDICATIONS:   These medications were titrated to patient response per physician's verbal order, Versed 8 mg, Fentanyl 100 mcg TOPICAL ANESTHETIC:  Cetacaine Spray  DESCRIPTION OF PROCEDURE:   After the risks benefits and alternatives of the procedure were thoroughly explained, informed consent was obtained.  The EG-[id number] ([id number]) endoscope was introduced through the mouth and advanced to the second portion of the duodenum, without limitations.  The instrument was slowly withdrawn as the mucosa was fully examined. <<PROCEDUREIMAGES>>  Multiple ulcers were found in the antrum. multiple erosions/ulcerations in the abtrum, one 5 mm ulcer at the incissura, no stigmata of recent bleeding With standard forceps, a biopsy was obtained and sent to pathology (see image2, image4, image6, image7, image8, and image9).  A hiatal hernia was found (see image11, image10, and image1). 2 cm sliding hiatal hernia Duodenitis was found (see image5).  Otherwise the examination was normal (see image3). s/p duodenal biopsies to r/o sprue Retroflexed views revealed no abnormalities.    The scope was then withdrawn from the patient and the procedure completed.  COMPLICATIONS:  None  ENDOSCOPIC IMPRESSION: 1) Ulcers, multiple in the antrum 2) Hiatal hernia 3) Duodenitis 4) Otherwise normal examination cause of bleeding likely from antral ulcerations/erosions, ? ASA induced? RECOMMENDATIONS: 1) Await biopsy results PPI advance diet hold Plavix and  ASA x 2-4 weeks  REPEAT EXAM:  In 0 year(s) for.  ______________________________ Hedwig Morton. Juanda Chance, MD  CC:  n. eSIGNED:   Hedwig Morton. Brodie at 05/15/2012 03:56 PM  [name], [phone]

## 2012-05-16 NOTE — Progress Notes (Signed)
[facility] PROGRESS NOTE  Ricardo Arnold ZOX:[id number] DOB: 05-20-1951 DOA: 05/13/2012 PCP: No primary provider on file. None Cardiologist: Peter Swaziland, MD Gastroenterologist: [name], MD  Assessment/Plan: 1. UGIB: Resolved, hemoglobin stable. Likely from multiple antral erosions, likely caused by ASA 325 and Plavix. Per discussion with Dr. Marjorie Smolder daily, iron supplement, keep off Plavix x2 weeks, check with cardiologist before resuming. ASA 81mg , coated. Followup 9/24 at 9:45 AM. 2. ABLA: Hemoglobin stable. No bleeding. Status-post transfusion.  3. History of CAD: Stents >8 years ago. ASA and Plavix on hold.  Code Status: full Family Communication: none at bedside Disposition Plan: home when improved  Brendia Sacks, MD  [facility] Team 4 Pager [phone]. If 8PM-8AM, please contact night-coverage at [email or web address], password [facility] 05/16/2012, 1:46 PM  LOS: 3 days   Brief narrative: 61 year old man on ASA and Plavix presente with melena, fatigue, generalized weakness.  Consultants:  [facility]  Procedures:  EGD 8/7--Multiple antral erosions and one superficial ulceration at the incisura, s/p biopses for H.pylori, no evidence of recent bleeding, likely caused by ASA 325 mg qd ( and Plavix)  HPI/Subjective: Feels fine, no bleeding.  Objective: Filed Vitals:   05/15/12 1630 05/15/12 1640 05/15/12 2241 05/16/12 0500  BP: 91/52 93/54 96/59  96/59  Pulse:   65 60  Temp:   97.8 F (36.6 C) 98.1 F (36.7 C)  TempSrc:   Oral Oral  Resp: 14 16 16 16   Height:      Weight:    74.299 kg (163 lb 12.8 oz)  SpO2: 96% 93% 96% 96%    Intake/Output Summary (Last 24 hours) at 05/16/12 1346 Last data filed at 05/16/12 0500  Gross per 24 hour  Intake 282.92 ml  Output   1000 ml  Net -717.08 ml   Wt Readings from Last 3 Encounters:  05/16/12 74.299 kg (163 lb 12.8 oz)  05/16/12 74.299 kg (163 lb 12.8 oz)  05/16/12 74.299 kg (163 lb 12.8 oz)     Exam:   General:  Appears calm and comforable  Cardiovascular: RRR, no m/r/g, no LE edema  Respiratory: CTA bilaterally, no w/r/r. Normal respiratory effort.  Data Reviewed: Basic Metabolic Panel:  Lab 05/15/12 8295 05/14/12 0252 05/13/12 1230  NA 140 141 141  K 3.6 3.7 3.9  CL 108 110 107  CO2 28 24 --  GLUCOSE 89 91 95  BUN 6 8 9   CREATININE 0.87 0.84 0.90  CALCIUM 8.8 8.1* --  MG -- -- --  PHOS -- -- --   CBC:  Lab 05/16/12 0410 05/15/12 0427 05/14/12 2100 05/14/12 0940 05/14/12 0252 05/13/12 1220  WBC 7.4 5.5 -- -- 6.9 9.6  NEUTROABS -- -- -- -- -- 6.8  HGB 9.6* 8.9* 9.3* 9.3* 9.0* --  HCT 29.0* 27.1* 27.9* 28.2* 26.8* --  MCV 89.8 89.1 -- -- 88.7 91.7  PLT 249 216 -- -- 177 209    Recent Results (from the past 240 hour(s))  MRSA PCR SCREENING     Status: Normal   Collection Time   05/13/12  4:29 PM      Component Value Range Status Comment   MRSA by PCR NEGATIVE  NEGATIVE Final     Studies: Dg Abd Portable 1v  05/13/2012  *RADIOLOGY REPORT*  Clinical Data: GI bleed  PORTABLE ABDOMEN - 1 VIEW  Comparison: None.  Findings: Scattered air and stool throughout the bowel.  Negative for obstruction or dilatation.  No ileus.  Streaky left lower lobe retrocardiac atelectasis.  Mild degenerative  changes of the lower thoracic spine.  No acute osseous finding or abnormal calcification.  IMPRESSION: Nonobstructive bowel gas pattern.  Original Report Authenticated By: Judie Petit. Ruel Favors, M.D.   Scheduled Meds:    . ferrous sulfate  300 mg Oral TID WC  . pantoprazole  40 mg Oral Q1200  . sennosides  10 mL Oral QHS  . sodium chloride  3 mL Intravenous Q12H   Continuous Infusions:    . DISCONTD: sodium chloride Stopped (05/15/12 1530)  . DISCONTD: pantoprozole (PROTONIX) infusion Stopped (05/16/12 1135)    Principal Problem:  *UGI bleed Active Problems:  Ischemic heart disease  History of acute lateral wall MI  Anemia, blood loss  Gastric ulcer with  hemorrhage     Brendia Sacks, MD  [facility] Team 4 Pager [phone]. If 8PM-8AM, please contact night-coverage at [email or web address], password [facility] 05/16/2012, 1:46 PM  LOS: 3 days

## 2012-05-16 NOTE — Progress Notes (Signed)
Subjective No complaint  Objective:feeling, fine, tolerated regular diet, Hgb up to 9.6, no stools, hemodynamically stable Vital signs in last 24 hours: Temp:  [97.8 F (36.6 C)-98.6 F (37 C)] 98.1 F (36.7 C) (08/08 0500) Pulse Rate:  [56-66] 60  (08/08 0500) Resp:  [12-21] 16  (08/08 0500) BP: (90-131)/(49-81) 96/59 mmHg (08/08 0500) SpO2:  [93 %-100 %] 96 % (08/08 0500) Weight:  [163 lb 12.8 oz (74.299 kg)] 163 lb 12.8 oz (74.299 kg) (08/08 0500) Last BM Date: 05/13/12 General:   Alert,  pleasant, cooperative in NAD Head:  Normocephalic and atraumatic. Eyes:  Sclera clear, no icterus.   Conjunctiva pink. Mouth:  No deformity or lesions, dentition normal. Neck:  Supple; no masses or thyromegaly. Heart:  Regular rate and rhythm; no murmurs, clicks, rubs,  or gallops. Lungs:  No wheezes or rales Abdomen:  Soft, non tender, active bowl sounds, liver edge at coastal margin  Msk:  Symmetrical without gross deformities. Normal posture. Pulses:  Normal pulses noted. Extremities:  Without clubbing or edema. Neurologic:  Alert and  oriented x4;  grossly normal neurologically. Skin:  Intact without significant lesions or rashes.  Intake/Output from previous day: 08/07 0701 - 08/08 0700 In: 282.9 [I.V.:282.9] Out: 1000 [Urine:1000] Intake/Output this shift:    Lab Results:  Basename 05/16/12 0410 05/15/12 0427 05/14/12 2100 05/14/12 0252  WBC 7.4 5.5 -- 6.9  HGB 9.6* 8.9* 9.3* --  HCT 29.0* 27.1* 27.9* --  PLT 249 216 -- 177   BMET  Basename 05/15/12 0427 05/14/12 0252 05/13/12 1230  NA 140 141 141  K 3.6 3.7 3.9  CL 108 110 107  CO2 28 24 --  GLUCOSE 89 91 95  BUN 6 8 9   CREATININE 0.87 0.84 0.90  CALCIUM 8.8 8.1* --   LFT No results found for this basename: PROT,ALBUMIN,AST,ALT,ALKPHOS,BILITOT,BILIDIR,IBILI in the last 72 hours PT/INR  Basename 05/13/12 1645  LABPROT 14.0  INR 1.06   Hepatitis Panel No results found for this basename:  HEPBSAG,HCVAB,HEPAIGM,HEPBIGM in the last 72 hours  Studies/Results: No results found.   ASSESSMENT:   Principal Problem:  *UGI bleed Active Problems:  Ischemic heart disease  History of acute lateral wall MI  Anemia, blood loss  Gastric ulcer with hemorrhage     PLAN:   S/p UGI bleed, ?acute , possibly subacute, likely due to Plavix/ASA, EGD showed multiple antral ulcerations none of them bleeding. He may be discharged today, on PPI once a day, Iron supplements and ASA 81mg  qd coated brand.  Hold Plavix x 2 weeks OV with me to set up a colonoscopy ( [phone]),  September 24, 9.45 am     LOS: 3 days   [name]  05/16/2012, 10:56 AM

## 2012-05-29 ENCOUNTER — Telehealth: Payer: Self-pay | Admitting: Cardiology

## 2012-05-29 NOTE — Telephone Encounter (Signed)
Spoke to patient he stated he was in hospital recently and was told he had multiple antral erosions likely caused by asa and plavix.Wants to check with Dr.Jordan to see if ok to stop plavix and decrease asa to 81 mg.Patient was told Dr.Jordan not in office today will check with him and call back tomorrow 05/30/12.

## 2012-05-29 NOTE — Telephone Encounter (Signed)
New msg Pt wants to discuss discontinuing plavix when he was in hospital. 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 Please call

## 2012-05-31 NOTE — Telephone Encounter (Signed)
Patient called no answer.Left message on personal voice mail spoke to Dr.Jordan ok to stop plavix and decrease aspirin 81 mg daily.

## 2012-06-12 ENCOUNTER — Encounter: Payer: Self-pay | Admitting: *Deleted

## 2012-06-19 ENCOUNTER — Telehealth: Payer: Self-pay

## 2012-06-19 NOTE — Telephone Encounter (Signed)
Received [employer] form from patient,was completed,signed by Dr.Jordan and mailed back to patient.Copy faxed to [phone] [facility] [employer].

## 2012-07-02 ENCOUNTER — Ambulatory Visit (INDEPENDENT_AMBULATORY_CARE_PROVIDER_SITE_OTHER): Payer: 59 | Admitting: Internal Medicine

## 2012-07-02 ENCOUNTER — Encounter: Payer: Self-pay | Admitting: Internal Medicine

## 2012-07-02 ENCOUNTER — Other Ambulatory Visit (INDEPENDENT_AMBULATORY_CARE_PROVIDER_SITE_OTHER): Payer: 59

## 2012-07-02 VITALS — BP 120/68 | HR 68 | Ht 71.5 in | Wt 169.0 lb

## 2012-07-02 DIAGNOSIS — D62 Acute posthemorrhagic anemia: Secondary | ICD-10-CM

## 2012-07-02 DIAGNOSIS — K922 Gastrointestinal hemorrhage, unspecified: Secondary | ICD-10-CM

## 2012-07-02 LAB — CBC WITH DIFFERENTIAL/PLATELET
Basophils Absolute: 0 10*3/uL (ref 0.0–0.1)
Eosinophils Absolute: 0.1 10*3/uL (ref 0.0–0.7)
HCT: 45.6 % (ref 39.0–52.0)
Hemoglobin: 14.8 g/dL (ref 13.0–17.0)
Lymphs Abs: 2.6 10*3/uL (ref 0.7–4.0)
MCHC: 32.4 g/dL (ref 30.0–36.0)
Neutro Abs: 3.9 10*3/uL (ref 1.4–7.7)
RDW: 15 % — ABNORMAL HIGH (ref 11.5–14.6)

## 2012-07-02 LAB — VITAMIN B12: Vitamin B-12: 512 pg/mL (ref 211–911)

## 2012-07-02 MED ORDER — LORAZEPAM 1 MG PO TABS: ORAL_TABLET | ORAL | Status: DC

## 2012-07-02 MED ORDER — MOVIPREP 100 G PO SOLR: 1.0000 | Freq: Once | ORAL | Status: DC

## 2012-07-02 NOTE — Progress Notes (Signed)
Ricardo Arnold 1951/04/15 MRN [id number]   History of Present Illness:  This is a 61 year old white male who had a major upper GI bleed due to gastric erosions caused by a combination of aspirin and Plavix which he took for coronary artery disease since 2004 when he had a Cypher stent placed for a lateral wall MI. He presented with a presyncopal episode and a hemoglobin of less than 8 g. He was found to have H. pylori negative gastric ulcers and duodenitis. He recovered very well while staying off Plavix and reducing his aspirin to 81 mg  daily. He also has been on Protonix 40 mg daily. He has no complaints today. He has never had a colonoscopy. He has been followed by Dr. Swaziland and was told that he may not need to go back on Plavix.   Past Medical History  Diagnosis Date  . Ischemic heart disease   . Hyperlipidemia   . History of acute lateral wall MI 2004    treated with a 3.0 x 18 mm Cypher stent/   . Hypokinesia     with EF of 49%  /by  cardiolite study  . Coronary atherosclerosis   . Anxiety   . Hiatal hernia   . Duodenitis   . Antral ulcer   . Upper GI bleed   . Anemia    Past Surgical History  Procedure Date  . Cardiac catheterization 03/09/2003    EF  50%   . Cardiac stents     2004  . Tonsillectomy   . Skin graft 2012  . Esophagogastroduodenoscopy 05/15/2012    Procedure: ESOPHAGOGASTRODUODENOSCOPY (EGD);  Surgeon: Hart Carwin, MD;  Location: [facility];  Service: Endoscopy;  Laterality: N/A;    reports that he quit smoking about 9 years ago. His smoking use included Cigarettes. He has a 32 pack-year smoking history. He has never used smokeless tobacco. He reports that he drinks alcohol. He reports that he does not use illicit drugs. family history includes Coronary artery disease in his father; Heart attack in his father; and Heart disease in his father. No Known Allergies      Review of Systems: Denies heartburn, abdominal pain or chest pain  The remainder of the  10 point ROS is negative except as outlined in H&P    Assessment and Plan:  Problem #1 Patient is now 6 weeks post upper GI bleed caused by multiple antral ulcerations likely caused by aspirin and Plavix affect. We will recheck his blood count as well as iron studies today. He may be able to reduce his Protonix to Pepcid which he may take over-the-counter 20 mg as needed. If Dr. Swaziland asks him to resume his, Plavix then I would suggest that he take  Pepcid on daily basis.  Problem #2 Colorectal screening. Patient has never had a colonoscopy. There is no family history of colon cancer. We will schedule a screening colonoscopy at his convenience.  Problem #3: CAD, post stent placement 2004, ?? Need to resume Plavix. Followed by Dr Swaziland.  07/02/2012 Ricardo Arnold

## 2012-07-02 NOTE — Patient Instructions (Addendum)
Your physician has requested that you go to the basement for the following lab work before leaving today: CBC, Iron, and B12  We have sent the following medications to your pharmacy for you to pick up at your convenience:Ativan  You have been scheduled for a colonoscopy with propofol. Please follow written instructions given to you at your visit today.  Please pick up your prep kit at the pharmacy within the next 1-3 days. If you use inhalers (even only as needed), please bring them with you on the day of your procedure.  CC:Dr Swaziland

## 2012-07-02 NOTE — Addendum Note (Signed)
Addended by: Ok Anis A on: 07/02/2012 02:12 PM   Modules accepted: Orders

## 2012-07-04 ENCOUNTER — Telehealth: Payer: Self-pay | Admitting: *Deleted

## 2012-07-04 NOTE — Telephone Encounter (Signed)
===  View-only below this line===  ----- Message -----    From: Peter M Swaziland, MD    Sent: 07/04/2012  11:07 AM      To: Richardson Chiquito, CMA  I would not resume Plavix in light of GI bleed. Continue ASA.  Peter Swaziland MD, [facility]

## 2012-07-05 NOTE — Telephone Encounter (Signed)
I have spoken to patient and advised that per Dr Swaziland, he would not have patient to resume Plavix due to recent GI bleed. Patient verbalizes understanding.

## 2012-07-08 ENCOUNTER — Telehealth: Payer: Self-pay | Admitting: Internal Medicine

## 2012-07-08 NOTE — Telephone Encounter (Signed)
Pt called to say that he ate a salad yesterday (3 days before procedure)  Told pt that it is ok and reviewed diet restrictions with him.

## 2012-07-10 ENCOUNTER — Other Ambulatory Visit: Payer: Self-pay | Admitting: *Deleted

## 2012-07-10 ENCOUNTER — Ambulatory Visit (AMBULATORY_SURGERY_CENTER): Payer: 59 | Admitting: Internal Medicine

## 2012-07-10 ENCOUNTER — Telehealth: Payer: Self-pay | Admitting: *Deleted

## 2012-07-10 ENCOUNTER — Encounter: Payer: Self-pay | Admitting: Internal Medicine

## 2012-07-10 ENCOUNTER — Ambulatory Visit (HOSPITAL_COMMUNITY)
Admission: RE | Admit: 2012-07-10 | Discharge: 2012-07-10 | Disposition: A | Discharge status: Home / Self Care | Payer: 59 | Source: Ambulatory Visit | Attending: Internal Medicine | Admitting: Internal Medicine

## 2012-07-10 VITALS — BP 119/79 | HR 62 | Temp 97.5°F | Resp 14 | Ht 71.5 in | Wt 169.0 lb

## 2012-07-10 DIAGNOSIS — Z862 Personal history of diseases of the blood and blood-forming organs and certain disorders involving the immune mechanism: Secondary | ICD-10-CM

## 2012-07-10 DIAGNOSIS — Z139 Encounter for screening, unspecified: Secondary | ICD-10-CM

## 2012-07-10 DIAGNOSIS — Q438 Other specified congenital malformations of intestine: Secondary | ICD-10-CM | POA: Insufficient documentation

## 2012-07-10 DIAGNOSIS — Z1211 Encounter for screening for malignant neoplasm of colon: Secondary | ICD-10-CM

## 2012-07-10 DIAGNOSIS — K5730 Diverticulosis of large intestine without perforation or abscess without bleeding: Secondary | ICD-10-CM | POA: Insufficient documentation

## 2012-07-10 DIAGNOSIS — K922 Gastrointestinal hemorrhage, unspecified: Secondary | ICD-10-CM

## 2012-07-10 MED ORDER — SODIUM CHLORIDE 0.9 % IV SOLN: 500.0000 mL | INTRAVENOUS | Status: DC

## 2012-07-10 NOTE — Telephone Encounter (Signed)
Scheduled barium enema with TIffany at [facility] radiology today at 11:15/11:30 AM. Faxed a copy of colonoscopy report to Joy at [phone] as requested.

## 2012-07-10 NOTE — Op Note (Signed)
[facility] [address] [place], [address]   COLONOSCOPY PROCEDURE REPORT  PATIENT: Darnell, Jeschke  MR#: [id number] BIRTHDATE: 1951-08-02 , 61  yrs. old GENDER: Male ENDOSCOPIST: Hart Carwin, MD PROCEDURE DATE:  07/10/2012 PROCEDURE:  sreening colonoscopy - incomplete ASA CLASS:   Class III INDICATIONS:average risk patient for colon cancer and recent hospitalization for an UGI bleed/anemia, syncopal episode. MEDICATIONS: MAC sedation, administered by CRNA and Propofol (Diprivan) 500 mg IV  DESCRIPTION OF PROCEDURE:   After the risks and benefits and of the procedure were explained, informed consent was obtained.  A digital rectal exam revealed no abnormalities of the rectum.    The LB PCF-Q180AL [id number] and LB CF-H180AL [id number]  endoscope was introduced through the anus and advanced to the hepatic flexure . The quality of the prep was excellent, using MoviPrep .  The instrument was then slowly withdrawn as the colon was fully examined.     COLON FINDINGS: Incomplete exam to about hepatic flexure due to scope looping, despite of switching to a stiffer scope and repositioning the pt.     Retroflexed views revealed no abnormalities.     The scope was then withdrawn from the patient and the procedure completed.  COMPLICATIONS: There were no complications. ENDOSCOPIC IMPRESSION: Incomplete exam to about hepatic flexure due to scope looping, despite of switching to a stiffer scope and repositioning the pt. ,limited exam, colon mucosa and lumen appears normal  RECOMMENDATIONS: My office will arrange to you to have a barium enema performed. This is a radiology test to further examine your colon.   REPEAT EXAM: depending on results of BE, if normal right colon then recall in 10 years, consider virtual colonoscopy  cc:  _______________________________ eSigned:  Hart Carwin, MD 07/10/2012 9:49 AM

## 2012-07-10 NOTE — Progress Notes (Signed)
Incomplete exam reached hepatic flexure. Attempted to reach cecum with 2 different scopes. Stopped after 24.05 first attempted and then with 2nd scope stopped after 12.44. Pt tolerated well.

## 2012-07-10 NOTE — Patient Instructions (Addendum)
Impressions/Recommendations:  Incomplete exam Barium enema to be done today 07/10/12 at [facility]. Arrive at 1115 am for 1130 am procedure.  YOU HAD AN ENDOSCOPIC PROCEDURE TODAY AT THE [facility]: Refer to the procedure report that was given to you for any specific questions about what was found during the examination.  If the procedure report does not answer your questions, please call your gastroenterologist to clarify.  If you requested that your care partner not be given the details of your procedure findings, then the procedure report has been included in a sealed envelope for you to review at your convenience later.  YOU SHOULD EXPECT: Some feelings of bloating in the abdomen. Passage of more gas than usual.  Walking can help get rid of the air that was put into your GI tract during the procedure and reduce the bloating. If you had a lower endoscopy (such as a colonoscopy or flexible sigmoidoscopy) you may notice spotting of blood in your stool or on the toilet paper. If you underwent a bowel prep for your procedure, then you may not have a normal bowel movement for a few days.  DIET: Your first meal following the procedure should be a light meal and then it is ok to progress to your normal diet.  A half-sandwich or bowl of soup is an example of a good first meal.  Heavy or fried foods are harder to digest and may make you feel nauseous or bloated.  Likewise meals heavy in dairy and vegetables can cause extra gas to form and this can also increase the bloating.  Drink plenty of fluids but you should avoid alcoholic beverages for 24 hours.  ACTIVITY: Your care partner should take you home directly after the procedure.  You should plan to take it easy, moving slowly for the rest of the day.  You can resume normal activity the day after the procedure however you should NOT DRIVE or use heavy machinery for 24 hours (because of the sedation medicines used during the test).     SYMPTOMS TO REPORT IMMEDIATELY: A gastroenterologist can be reached at any hour.  During normal business hours, 8:30 AM to 5:00 PM Monday through Friday, call [phone].  After hours and on weekends, please call the GI answering service at [phone] who will take a message and have the physician on call contact you.   Following lower endoscopy (colonoscopy or flexible sigmoidoscopy):  Excessive amounts of blood in the stool  Significant tenderness or worsening of abdominal pains  Swelling of the abdomen that is new, acute  Fever of 100F or higher   FOLLOW UP: If any biopsies were taken you will be contacted by phone or by letter within the next 1-3 weeks.  Call your gastroenterologist if you have not heard about the biopsies in 3 weeks.  Our staff will call the home number listed on your records the next business day following your procedure to check on you and address any questions or concerns that you may have at that time regarding the information given to you following your procedure. This is a courtesy call and so if there is no answer at the home number and we have not heard from you through the emergency physician on call, we will assume that you have returned to your regular daily activities without incident.  SIGNATURES/CONFIDENTIALITY: You and/or your care partner have signed paperwork which will be entered into your electronic medical record.  These signatures attest to the  fact that that the information above on your After Visit Summary has been reviewed and is understood.  Full responsibility of the confidentiality of this discharge information lies with you and/or your care-partner.

## 2012-07-10 NOTE — Progress Notes (Signed)
Patient did not experience any of the following events: a burn prior to discharge; a fall within the facility; wrong site/side/patient/procedure/implant event; or a hospital transfer or hospital admission upon discharge from the facility. (G8907) Patient did not have preoperative order for IV antibiotic SSI prophylaxis. (G8918)  

## 2012-07-11 ENCOUNTER — Telehealth: Payer: Self-pay | Admitting: *Deleted

## 2012-07-11 NOTE — Telephone Encounter (Signed)
No answer left message to call if questions or concerns. 

## 2013-01-27 ENCOUNTER — Other Ambulatory Visit: Payer: Self-pay | Admitting: Cardiology

## 2013-02-25 ENCOUNTER — Telehealth: Payer: Self-pay

## 2013-02-25 ENCOUNTER — Other Ambulatory Visit: Payer: Self-pay | Admitting: Cardiology

## 2013-02-25 MED ORDER — ATORVASTATIN CALCIUM 40 MG PO TABS: 20.0000 mg | ORAL_TABLET | Freq: Every day | ORAL | Status: DC

## 2013-02-25 NOTE — Telephone Encounter (Signed)
Called because he needs to schedule a follow up office visit to receive any further refills after today's refill. atorvastatin (LIPITOR) 20 MG tablet  TAKE 1 TABLET EVERY DAY   30 tablet   6    Patient Instructions  Continue your current medication. Get back into your exercise routine.  We will schedule you for fasting lab work.  I will see you again in 1 year with fasting lab.  Previous Visit      Provider Department Encounter #    12/08/2011 11:02 AM Peter Swaziland, MD [facility] [id number]

## 2013-04-18 ENCOUNTER — Other Ambulatory Visit: Payer: Self-pay | Admitting: Cardiology

## 2013-04-18 ENCOUNTER — Telehealth: Payer: Self-pay | Admitting: Cardiology

## 2013-04-18 DIAGNOSIS — E785 Hyperlipidemia, unspecified: Secondary | ICD-10-CM

## 2013-04-18 NOTE — Telephone Encounter (Signed)
New Prob      Pt has some questions regarding following up with Dr. Swaziland and refills. Please call.

## 2013-04-18 NOTE — Telephone Encounter (Signed)
Returned call to patient no answer.LMTC. 

## 2013-04-22 MED ORDER — ATORVASTATIN CALCIUM 40 MG PO TABS: 40.0000 mg | ORAL_TABLET | Freq: Every day | ORAL | Status: DC

## 2013-04-22 NOTE — Telephone Encounter (Signed)
Spoke to patient he stated he had been taking lipitor 40 mg daily until he picked up refill and it said to take lipitor 40 mg 1/2 tablet daily and he dont remember it being changed.When looking in chart patient should be taking lipitor 40 mg daily.Lipitor 40 mg prescription sent to pharmacy.Appointment scheduled for fasting lab 05/20/13 and appointment with Dr.Jordan 05/29/13

## 2013-04-23 NOTE — Telephone Encounter (Signed)
See previous 04/22/13 note.

## 2013-05-20 ENCOUNTER — Other Ambulatory Visit (INDEPENDENT_AMBULATORY_CARE_PROVIDER_SITE_OTHER): Payer: 59

## 2013-05-20 DIAGNOSIS — E785 Hyperlipidemia, unspecified: Secondary | ICD-10-CM

## 2013-05-20 LAB — BASIC METABOLIC PANEL
BUN: 9 mg/dL (ref 6–23)
Calcium: 9.6 mg/dL (ref 8.4–10.5)
Creatinine, Ser: 0.9 mg/dL (ref 0.4–1.5)
GFR: 86.9 mL/min (ref >60.00)

## 2013-05-20 LAB — HEPATIC FUNCTION PANEL
ALT: 19 U/L (ref 0–53)
AST: 26 U/L (ref 0–37)
Alkaline Phosphatase: 69 U/L (ref 39–117)
Bilirubin, Direct: 0 mg/dL (ref 0.0–0.3)
Total Protein: 7.2 g/dL (ref 6.0–8.3)

## 2013-05-20 LAB — LIPID PANEL: Cholesterol: 146 mg/dL (ref 0–200)

## 2013-05-29 ENCOUNTER — Ambulatory Visit (INDEPENDENT_AMBULATORY_CARE_PROVIDER_SITE_OTHER): Payer: 59 | Admitting: Cardiology

## 2013-05-29 ENCOUNTER — Encounter: Payer: Self-pay | Admitting: Cardiology

## 2013-05-29 VITALS — BP 132/94 | HR 62 | Ht 71.5 in | Wt 169.2 lb

## 2013-05-29 DIAGNOSIS — I259 Chronic ischemic heart disease, unspecified: Secondary | ICD-10-CM

## 2013-05-29 DIAGNOSIS — E785 Hyperlipidemia, unspecified: Secondary | ICD-10-CM

## 2013-05-29 DIAGNOSIS — I252 Old myocardial infarction: Secondary | ICD-10-CM

## 2013-05-29 NOTE — Patient Instructions (Signed)
Continue your current therapy  I will see you in one year   

## 2013-05-29 NOTE — Progress Notes (Signed)
Subjective:   Ricardo Arnold comes in today for follow up.  He has a history of a lateral myocardial infarction in March 2004 treated with a 3.0 x 18 mm Cypher stent. Followup catheterization in 2004 showed persistent patency of the stented segment but mild to moderate diffuse coronary atherosclerosis. He had followup nuclear stress test in 2009 and 2012 which showed a fixed lateral wall defect area and there was no ischemia. Ejection fraction was 50%. He was admitted with an upper GI bleed in August of 2013. Endoscopy demonstrated an antral ulcer and some duodenitis. He was treated with Protonix. His Plavix was discontinued. He remains on just a baby aspirin daily. Subsequent colonoscopy was negative. He has had no recurrent bleeding since that time. He denies any chest pain or shortness of breath. He does exercise regularly.   Current Outpatient Prescriptions  Medication Sig Dispense Refill  . aspirin 81 MG EC tablet Take 1 tablet (81 mg total) by mouth daily. Swallow whole.      Marland Kitchen atorvastatin (LIPITOR) 40 MG tablet Take 1 tablet (40 mg total) by mouth daily.  30 tablet  6  . Multiple Vitamins-Minerals (CENTRUM SILVER PO) Take 1 tablet by mouth daily.       No current facility-administered medications for this visit.    No Known Allergies  Patient Active Problem List   Diagnosis Date Noted  . Gastric ulcer with hemorrhage 05/15/2012  . UGI bleed 05/13/2012  . Anemia, blood loss 05/13/2012  . Ischemic heart disease   . Hyperlipidemia   . History of acute lateral wall MI     History  Smoking status  . Former Smoker -- 1.00 packs/day for 32 years  . Types: Cigarettes  . Quit date: 10/09/2002  Smokeless tobacco  . Never Used    History  Alcohol Use  . Yes    Comment: 2 drinks a day 2-3 times per week.    Family History  Problem Relation Age of Onset  . Coronary artery disease Father     had bypass in his 70's  . Heart disease Father   . Heart attack Father     Review of Systems:    As noted in history of present illness.  All other systems were reviewed and are negative.   Physical Exam:   BP 132/94  Pulse 62  Ht 5' 11.5" (1.816 m)  Wt 169 lb 3.2 oz (76.749 kg)  BMI 23.27 kg/m2 The head is normocephalic and atraumatic.  Pupils are equally round and reactive to light.  Sclerae nonicteric.  Conjunctiva is clear.  Oropharynx is unremarkable.    Neck is supple there are no masses.  Thyroid is not enlarged.  There is no lymphadenopathy.  Lungs are clear.  Chest is symmetric.  Heart shows a regular rate and rhythm.  S1 and S2 are normal.  There is no murmur click or gallop.  Abdomen is soft normal bowel sounds.  There is no organomegaly.   Extremities are without edema.  Peripheral pulses are 2+.  Neurologically intact.  Skin is warm and dry.  Laboratory data: ECG today shows normal sinus rhythm with left axis deviation. Otherwise normal. Lab Results  Component Value Date   WBC 7.3 07/02/2012   HGB 14.8 07/02/2012   HCT 45.6 07/02/2012   PLT 229.0 07/02/2012   GLUCOSE 82 05/20/2013   CHOL 146 05/20/2013   TRIG 103.0 05/20/2013   HDL 45.80 05/20/2013   LDLCALC 80 05/20/2013   ALT 19 05/20/2013  AST 26 05/20/2013   NA 140 05/20/2013   K 4.2 05/20/2013   CL 104 05/20/2013   CREATININE 0.9 05/20/2013   BUN 9 05/20/2013   CO2 30 05/20/2013   INR 1.06 05/13/2012     Assessment / Plan: 1. Coronary disease with remote stenting of the left circumflex coronary with a drug-eluting stent in 2004. Last Myoview in 2012 showed no ischemia. He is asymptomatic. We will continue with baby aspirin daily. 2. History of GI bleed secondary to antral ulcer. Now resolved. Plavix discontinued. 3. Hypercholesterolemia-well controlled on Lipitor.

## 2013-06-20 IMAGING — CR DG ABD PORTABLE 1V
1 series · 2 of 2 positions shown · non-contrast
Comparison: None.

CLINICAL DATA: GI bleed

PORTABLE ABDOMEN - 1 VIEW

[Series 1: ap (kub) · U · 2 of 2 slices shown]
[im 1/2]
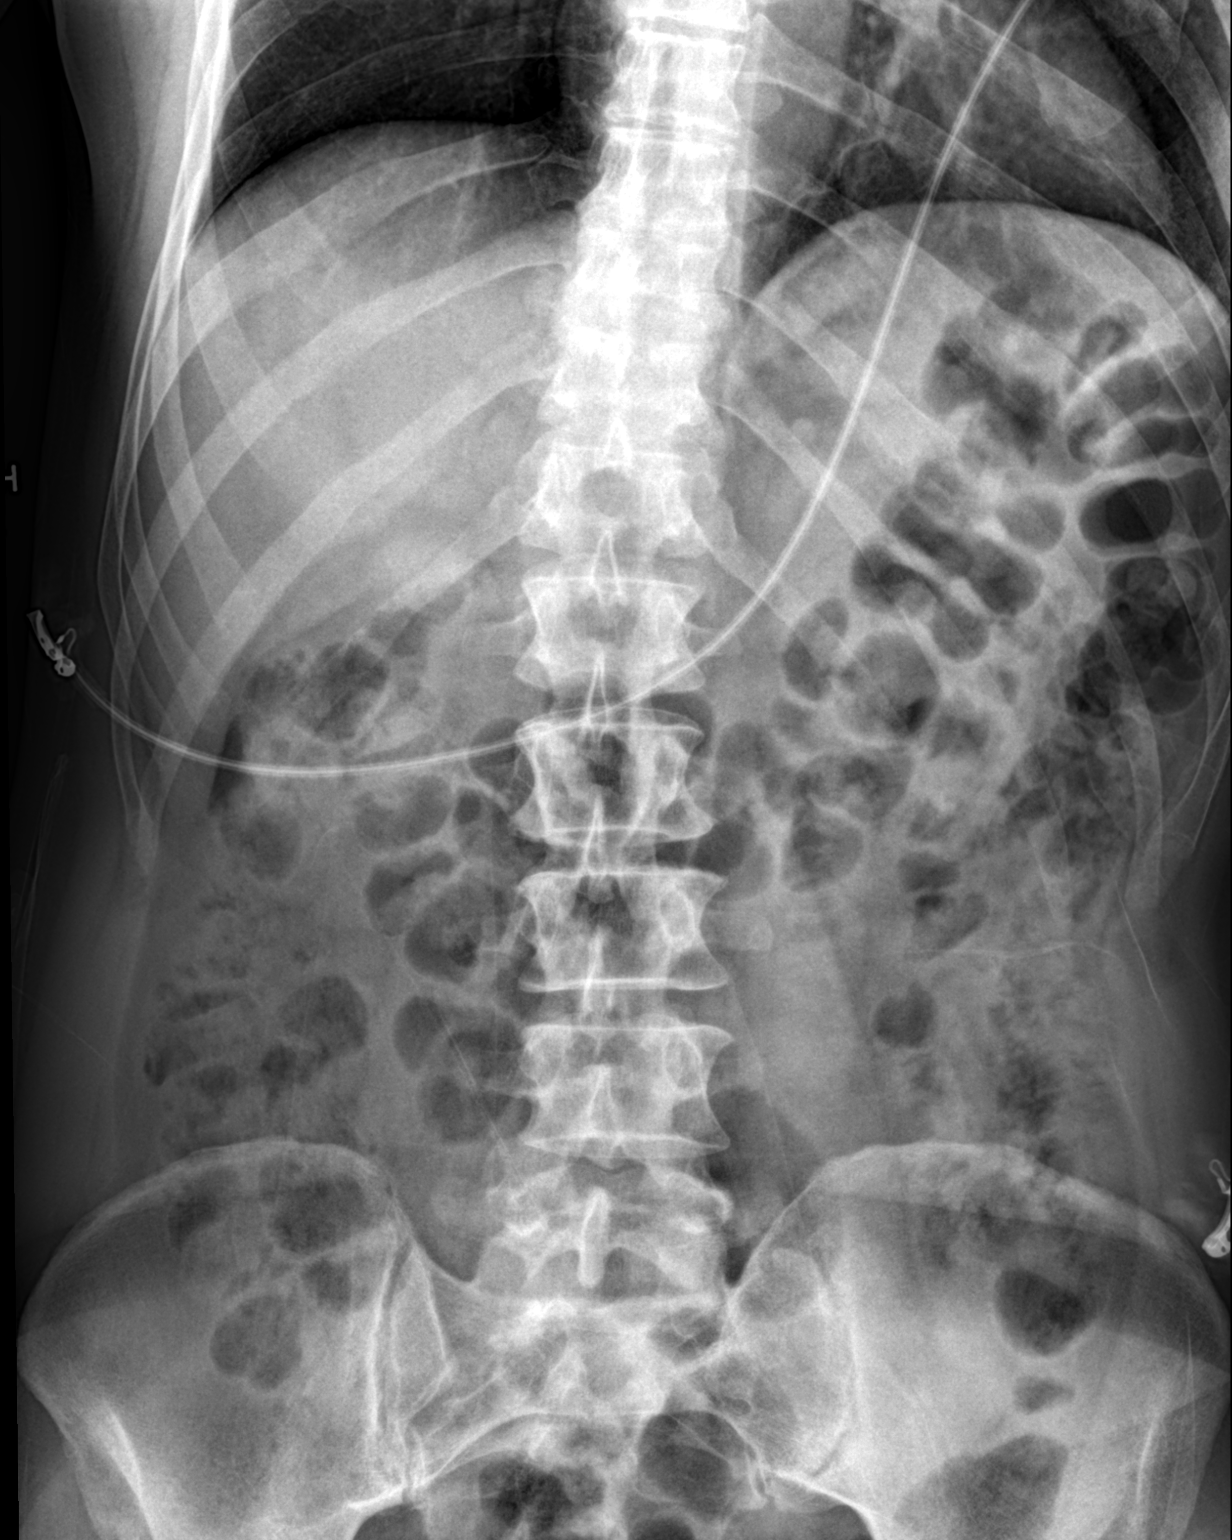
[im 2/2]
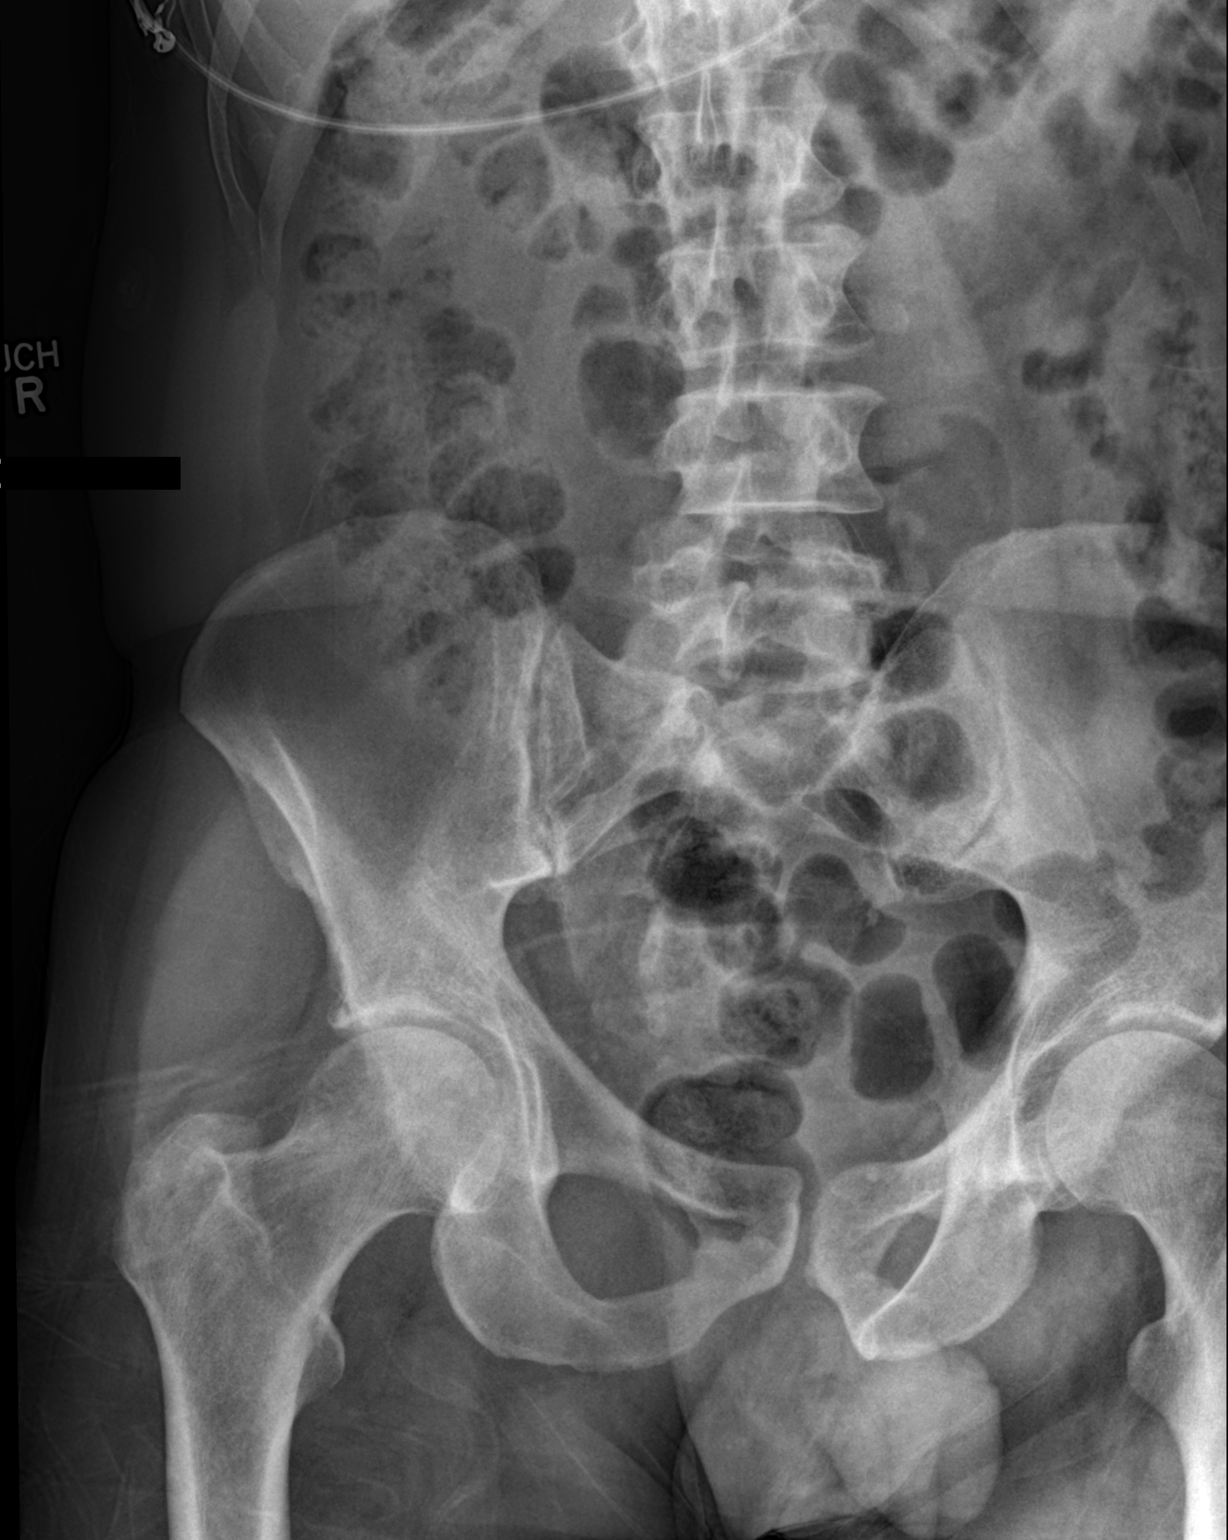

[2 of 2 positions shown; findings below may reference images not displayed]

FINDINGS: Scattered air and stool throughout the bowel.  Negative
for obstruction or dilatation.  No ileus.  Streaky left lower lobe
retrocardiac atelectasis.  Mild degenerative changes of the lower
thoracic spine.  No acute osseous finding or abnormal
calcification.
IMPRESSION: Nonobstructive bowel gas pattern.

## 2013-06-23 ENCOUNTER — Ambulatory Visit: Payer: 59 | Admitting: Cardiology

## 2013-11-15 ENCOUNTER — Other Ambulatory Visit: Payer: Self-pay | Admitting: Cardiology

## 2014-03-05 ENCOUNTER — Telehealth: Payer: Self-pay | Admitting: Cardiology

## 2014-03-05 NOTE — Telephone Encounter (Signed)
Follow up ° ° ° ° ° ° ° ° ° °Pt returning nurses call °

## 2014-03-05 NOTE — Telephone Encounter (Signed)
New Message:  Pt wants to know if he can have fasting blood work drawn at the [facility] on the same morning of his appt in Aug. Pt is requesting a call back from the nurse.

## 2014-03-05 NOTE — Telephone Encounter (Signed)
Returned call to patient he stated he would like to have fasting lab work before he sees Dr.Jordan 06/05/14.Advised to come to [facility] office for fasting lab 05/29/14.

## 2014-03-05 NOTE — Telephone Encounter (Signed)
Returned call to patient no answer.LMTC. 

## 2014-03-06 ENCOUNTER — Other Ambulatory Visit: Payer: Self-pay

## 2014-03-06 DIAGNOSIS — I259 Chronic ischemic heart disease, unspecified: Secondary | ICD-10-CM

## 2014-03-06 DIAGNOSIS — E785 Hyperlipidemia, unspecified: Secondary | ICD-10-CM

## 2014-05-29 ENCOUNTER — Other Ambulatory Visit (INDEPENDENT_AMBULATORY_CARE_PROVIDER_SITE_OTHER): Payer: 59

## 2014-05-29 DIAGNOSIS — I259 Chronic ischemic heart disease, unspecified: Secondary | ICD-10-CM

## 2014-05-29 DIAGNOSIS — E785 Hyperlipidemia, unspecified: Secondary | ICD-10-CM

## 2014-05-30 LAB — BASIC METABOLIC PANEL
BUN: 10 mg/dL (ref 6–23)
CHLORIDE: 104 meq/L (ref 96–112)
CO2: 30 meq/L (ref 19–32)
Calcium: 9.4 mg/dL (ref 8.4–10.5)
Creatinine, Ser: 0.9 mg/dL (ref 0.4–1.5)
GFR: 95.96 mL/min (ref >60.00)
Glucose, Bld: 80 mg/dL (ref 70–99)
POTASSIUM: 3.9 meq/L (ref 3.5–5.1)
Sodium: 140 mEq/L (ref 135–145)

## 2014-05-30 LAB — HEPATIC FUNCTION PANEL
ALT: 24 U/L (ref 0–53)
AST: 29 U/L (ref 0–37)
Albumin: 4.1 g/dL (ref 3.5–5.2)
Alkaline Phosphatase: 71 U/L (ref 39–117)
BILIRUBIN DIRECT: 0 mg/dL (ref 0.0–0.3)
BILIRUBIN TOTAL: 0.8 mg/dL (ref 0.2–1.2)
Total Protein: 7.2 g/dL (ref 6.0–8.3)

## 2014-05-30 LAB — LIPID PANEL
Cholesterol: 156 mg/dL (ref 0–200)
HDL: 45.7 mg/dL (ref >39.00)
LDL Cholesterol: 85 mg/dL (ref 0–99)
NONHDL: 110.3
TRIGLYCERIDES: 126 mg/dL (ref 0.0–149.0)
Total CHOL/HDL Ratio: 3
VLDL: 25.2 mg/dL (ref 0.0–40.0)

## 2014-06-05 ENCOUNTER — Encounter: Payer: Self-pay | Admitting: Cardiology

## 2014-06-05 ENCOUNTER — Ambulatory Visit (INDEPENDENT_AMBULATORY_CARE_PROVIDER_SITE_OTHER): Payer: 59 | Admitting: Cardiology

## 2014-06-05 VITALS — BP 110/68 | HR 63 | Ht 72.0 in | Wt 174.8 lb

## 2014-06-05 DIAGNOSIS — I252 Old myocardial infarction: Secondary | ICD-10-CM

## 2014-06-05 DIAGNOSIS — E785 Hyperlipidemia, unspecified: Secondary | ICD-10-CM

## 2014-06-05 DIAGNOSIS — I2510 Atherosclerotic heart disease of native coronary artery without angina pectoris: Secondary | ICD-10-CM | POA: Insufficient documentation

## 2014-06-05 NOTE — Progress Notes (Signed)
Subjective:   Ricardo Arnold comes in today for follow up.  He has a history of a lateral myocardial infarction in March 2004 treated with a 3.0 x 18 mm Cypher stent. Followup catheterization in 2004 showed persistent patency of the stented segment but mild to moderate diffuse coronary atherosclerosis. He had followup nuclear stress test in 2009 and 2012 which showed a fixed lateral wall defect area and there was no ischemia. Ejection fraction was 50%. He had an upper GI bleed in August of 2013 and Plavix was discontinued.  No further bleeding. He denies any chest pain or shortness of breath. He does exercise regularly.   Current Outpatient Prescriptions  Medication Sig Dispense Refill  . aspirin 81 MG EC tablet Take 1 tablet (81 mg total) by mouth daily. Swallow whole.      Marland Kitchen atorvastatin (LIPITOR) 40 MG tablet TAKE 1 TABLET (40 MG TOTAL) BY MOUTH DAILY.  30 tablet  6  . Multiple Vitamins-Minerals (CENTRUM SILVER PO) Take 1 tablet by mouth daily.       No current facility-administered medications for this visit.    No Known Allergies  Patient Active Problem List   Diagnosis Date Noted  . CAD in native artery 06/05/2014  . Gastric ulcer with hemorrhage 05/15/2012  . UGI bleed 05/13/2012  . Anemia, blood loss 05/13/2012  . Ischemic heart disease   . Hyperlipidemia   . History of acute lateral wall MI     History  Smoking status  . Former Smoker -- 1.00 packs/day for 32 years  . Types: Cigarettes  . Quit date: 10/09/2002  Smokeless tobacco  . Never Used    History  Alcohol Use  . Yes    Comment: 2 drinks a day 2-3 times per week.    Family History  Problem Relation Age of Onset  . Coronary artery disease Father     had bypass in his 50's  . Heart disease Father   . Heart attack Father     Review of Systems:   As noted in history of present illness.  All other systems were reviewed and are negative.   Physical Exam:   BP 110/68  Pulse 63  Ht 6' (1.829 m)  Wt 174 lb 12.8 oz  (79.289 kg)  BMI 23.70 kg/m2 The head is normocephalic and atraumatic.  Pupils are equally round and reactive to light.  Sclerae nonicteric.  Conjunctiva is clear.  Oropharynx is unremarkable.    Neck is supple there are no masses.  Thyroid is not enlarged.  There is no lymphadenopathy.  Lungs are clear.  Chest is symmetric.  Heart shows a regular rate and rhythm.  S1 and S2 are normal.  There is no murmur click or gallop.  Abdomen is soft normal bowel sounds.  There is no organomegaly.   Extremities are without edema.  Peripheral pulses are 2+.  Neurologically intact.  Skin is warm and dry.  Laboratory data: ECG today shows normal sinus rhythm with left axis deviation. Mild nonspecific TWA.   Lab Results  Component Value Date   WBC 7.3 07/02/2012   HGB 14.8 07/02/2012   HCT 45.6 07/02/2012   PLT 229.0 07/02/2012   GLUCOSE 80 05/29/2014   CHOL 156 05/29/2014   TRIG 126.0 05/29/2014   HDL 45.70 05/29/2014   LDLCALC 85 05/29/2014   ALT 24 05/29/2014   AST 29 05/29/2014   NA 140 05/29/2014   K 3.9 05/29/2014   CL 104 05/29/2014   CREATININE 0.9 05/29/2014  BUN 10 05/29/2014   CO2 30 05/29/2014   INR 1.06 05/13/2012     Assessment / Plan: 1. Coronary disease with remote stenting of the left circumflex coronary with a drug-eluting stent in 2004. Last Myoview in 2012 showed no ischemia. He is asymptomatic. We will continue with baby aspirin daily. Will update Nuclear stress test at this time.  2. History of GI bleed secondary to antral ulcer. Now resolved.  3. Hypercholesterolemia-well controlled on Lipitor.

## 2014-06-05 NOTE — Patient Instructions (Signed)
We will schedule you for a nuclear stress test.  I will see you in one year.

## 2014-06-16 ENCOUNTER — Telehealth (HOSPITAL_COMMUNITY): Payer: Self-pay

## 2014-06-16 NOTE — Telephone Encounter (Signed)
Encounter complete. 

## 2014-06-17 ENCOUNTER — Telehealth (HOSPITAL_COMMUNITY): Payer: Self-pay

## 2014-06-17 NOTE — Telephone Encounter (Signed)
Encounter complete. 

## 2014-06-18 ENCOUNTER — Ambulatory Visit (HOSPITAL_COMMUNITY)
Admission: RE | Admit: 2014-06-18 | Discharge: 2014-06-18 | Disposition: A | Discharge status: Home / Self Care | Payer: 59 | Source: Ambulatory Visit | Attending: Cardiovascular Disease | Admitting: Cardiovascular Disease

## 2014-06-18 DIAGNOSIS — R030 Elevated blood-pressure reading, without diagnosis of hypertension: Secondary | ICD-10-CM | POA: Insufficient documentation

## 2014-06-18 DIAGNOSIS — Z9861 Coronary angioplasty status: Secondary | ICD-10-CM | POA: Insufficient documentation

## 2014-06-18 DIAGNOSIS — I2510 Atherosclerotic heart disease of native coronary artery without angina pectoris: Secondary | ICD-10-CM | POA: Diagnosis not present

## 2014-06-18 DIAGNOSIS — I252 Old myocardial infarction: Secondary | ICD-10-CM | POA: Diagnosis not present

## 2014-06-18 DIAGNOSIS — E785 Hyperlipidemia, unspecified: Secondary | ICD-10-CM

## 2014-06-18 DIAGNOSIS — Z8249 Family history of ischemic heart disease and other diseases of the circulatory system: Secondary | ICD-10-CM | POA: Diagnosis present

## 2014-06-18 DIAGNOSIS — J45909 Unspecified asthma, uncomplicated: Secondary | ICD-10-CM | POA: Insufficient documentation

## 2014-06-18 MED ORDER — TECHNETIUM TC 99M SESTAMIBI GENERIC - CARDIOLITE
30.4000 | Freq: Once | INTRAVENOUS | Status: AC | PRN
Start: 2014-06-18 — End: 2014-06-18
  Administered 2014-06-18: 30 via INTRAVENOUS

## 2014-06-18 MED ORDER — TECHNETIUM TC 99M SESTAMIBI GENERIC - CARDIOLITE
10.5000 | Freq: Once | INTRAVENOUS | Status: AC | PRN
  Administered 2014-06-18: 11 via INTRAVENOUS

## 2014-06-18 NOTE — Procedures (Addendum)
[place] [address] [place] [address] [phone]  Cardiology Nuclear Med Study  Ricardo Arnold is a 63 y.o. male     MRN : [id number]     DOB: [birth year]  Procedure Date: 06/18/2014  Nuclear Med Background Indication for Stress Test:  Stent Patency History:  Asthma and CAD;MI-12/2002;STENT/PTCA-12/2002;Last NUC MPI on 06//-nonischemic;EF=%. Cardiac Risk Factors: Family History - CAD, History of Smoking and Lipids  Symptoms:  Pt denies all symptoms at this time.   Nuclear Pre-Procedure Caffeine/Decaff Intake:  7:00pm NPO After: 5:00am   IV Site: R Antecubital  IV 0.9% NS with Angio Cath:  22g  Chest Size (in): 44"   IV Started by: Rolene Course, RN  Height: 6' (1.829 m)  Cup Size: n/a  BMI:  Body mass index is 23.59 kg/(m^2). Weight:  174 lb (78.926 kg)   Tech Comments:  n/a    Nuclear Med Study 1 or 2 day study: 1 day  Stress Test Type:  Stress  Order Authorizing Provider:  Peter Martinique, MD   Resting Radionuclide: Technetium 14m Sestamibi  Resting Radionuclide Dose: 10.5 mCi   Stress Radionuclide:  Technetium 102m Sestamibi  Stress Radionuclide Dose: 30.4 mCi           Stress Protocol Rest HR: 52 Stress HR: 150  Rest BP: 133/90 Stress BP: 157/92  Exercise Time (min): 12:30 METS: 14.3   Predicted Max HR: 159 bpm % Max HR: 94.34 bpm Rate Pressure Product: 23550  Dose of Adenosine (mg):  n/a Dose of Lexiscan: n/a mg  Dose of Atropine (mg): n/a Dose of Dobutamine: n/a mcg/kg/min (at max HR)  Stress Test Technologist: Leane Para, CCT Nuclear Technologist: Imagene Riches, CNMT   Rest Procedure:  Myocardial perfusion imaging was performed at rest 45 minutes following the intravenous administration of Technetium 60m Sestamibi. Stress Procedure:  The patient performed treadmill exercise using a Bruce  Protocol for 12:30 minutes. The patient stopped due to SOB and Fatigue and denied any chest pain.  There  were no significant ST-T wave changes.  Technetium 76m Sestamibi was injected IV at peak exercise and myocardial perfusion imaging was performed after a brief delay.  Transient Ischemic Dilatation (Normal <1.22):  1.17  QGS EDV:  99 ml QGS ESV:  50 ml LV Ejection Fraction: 50%  Rest ECG: NSR, IVCD  Stress ECG: No significant ST segment change suggestive of ischemia.  QPS Raw Data Images:  Normal; no motion artifact; normal heart/lung ratio. Stress Images:  There is decreased uptake in the lateral wall. Rest Images:  Comparison with the stress images reveals no significant change. Subtraction (SDS):  There is a fixed defect that is most consistent with a previous infarction. LV Wall Motion:  Borderline overall LV function. Lateral hypokinesis. EF 50%  Impression Exercise Capacity:  Excellent exercise capacity. BP Response:  Hypertensive blood pressure response. Clinical Symptoms:  No significant symptoms noted. ECG Impression:  No significant ST segment change suggestive of ischemia. Comparison with Prior Nuclear Study: No significant change from previous study   Overall Impression:  Low risk stress nuclear study with a scar in the distribution of the left circumfex coronary artery.   [name], MD  06/18/2014 12:35 PM

## 2014-06-29 ENCOUNTER — Other Ambulatory Visit: Payer: Self-pay

## 2014-06-29 MED ORDER — ATORVASTATIN CALCIUM 40 MG PO TABS: ORAL_TABLET | ORAL | Status: DC

## 2015-02-11 ENCOUNTER — Other Ambulatory Visit: Payer: Self-pay | Admitting: *Deleted

## 2015-02-11 MED ORDER — ATORVASTATIN CALCIUM 40 MG PO TABS: ORAL_TABLET | ORAL | Status: DC

## 2015-02-11 NOTE — Telephone Encounter (Signed)
Rx(s) sent to pharmacy electronically.  

## 2015-05-20 ENCOUNTER — Other Ambulatory Visit: Payer: Self-pay | Admitting: Cardiology

## 2015-07-24 ENCOUNTER — Other Ambulatory Visit: Payer: Self-pay | Admitting: Cardiology

## 2015-08-10 ENCOUNTER — Other Ambulatory Visit: Payer: Self-pay | Admitting: Cardiology

## 2015-08-22 ENCOUNTER — Other Ambulatory Visit: Payer: Self-pay | Admitting: Cardiology

## 2015-12-28 ENCOUNTER — Telehealth: Payer: Self-pay | Admitting: Cardiology

## 2015-12-28 MED ORDER — ATORVASTATIN CALCIUM 40 MG PO TABS: 40.0000 mg | ORAL_TABLET | Freq: Every day | ORAL | Status: DC

## 2015-12-28 NOTE — Telephone Encounter (Signed)
°*  STAT* If patient is at the pharmacy, call can be transferred to refill team.   1. Which medications need to be refilled? (please list name of each medication and dose if known) Atorvastatin 40mg   2. Which pharmacy/location (including street and city if local pharmacy) is medication to be sent to?[facility] on [address]  3. Do they need a 30 day or 90 day supply? [place]

## 2015-12-28 NOTE — Telephone Encounter (Signed)
Rx(s) sent to pharmacy electronically.  

## 2016-02-21 ENCOUNTER — Other Ambulatory Visit: Payer: Self-pay | Admitting: Cardiology

## 2016-02-21 MED ORDER — ATORVASTATIN CALCIUM 40 MG PO TABS: 40.0000 mg | ORAL_TABLET | Freq: Every day | ORAL | Status: DC

## 2016-02-21 NOTE — Telephone Encounter (Signed)
Rx request sent to pharmacy.  

## 2016-02-21 NOTE — Addendum Note (Signed)
Addended by: Therisa Doyne on: 02/21/2016 03:01 PM   Modules accepted: Orders

## 2016-02-23 ENCOUNTER — Encounter: Payer: Self-pay | Admitting: Cardiology

## 2016-02-23 ENCOUNTER — Ambulatory Visit (INDEPENDENT_AMBULATORY_CARE_PROVIDER_SITE_OTHER): Payer: 59 | Admitting: Cardiology

## 2016-02-23 VITALS — BP 124/80 | HR 60 | Ht 72.0 in | Wt 172.0 lb

## 2016-02-23 DIAGNOSIS — E785 Hyperlipidemia, unspecified: Secondary | ICD-10-CM | POA: Diagnosis not present

## 2016-02-23 DIAGNOSIS — I2510 Atherosclerotic heart disease of native coronary artery without angina pectoris: Secondary | ICD-10-CM

## 2016-02-23 NOTE — Progress Notes (Signed)
Subjective:   Ricardo Arnold comes in today for follow up CAD.  He has a history of a lateral myocardial infarction in March 2004 treated with a 3.0 x 18 mm Cypher stent. Followup catheterization in 2004 showed persistent patency of the stented segment but mild to moderate diffuse coronary atherosclerosis. He had followup nuclear stress test in 2009, 2012, and August 2015 which showed a fixed lateral wall defect area and there was no ischemia. Ejection fraction was 50%. He had an upper GI bleed in August of 2013 and Plavix was discontinued.  No further bleeding. He denies any chest pain or shortness of breath. He does exercise regularly. He enjoys riding a bike.    Current Outpatient Prescriptions  Medication Sig Dispense Refill  . aspirin 81 MG EC tablet Take 1 tablet (81 mg total) by mouth daily. Swallow whole.    Marland Kitchen atorvastatin (LIPITOR) 40 MG tablet Take 1 tablet (40 mg total) by mouth daily at 6 PM. 90 tablet 0  . Multiple Vitamins-Minerals (CENTRUM SILVER PO) Take 1 tablet by mouth daily.     No current facility-administered medications for this visit.    No Known Allergies  Patient Active Problem List   Diagnosis Date Noted  . CAD in native artery 06/05/2014  . Gastric ulcer with hemorrhage 05/15/2012  . UGI bleed 05/13/2012  . Anemia, blood loss 05/13/2012  . Ischemic heart disease   . Hyperlipidemia   . History of acute lateral wall MI     History  Smoking status  . Former Smoker -- 1.00 packs/day for 32 years  . Types: Cigarettes  . Quit date: 10/09/2002  Smokeless tobacco  . Never Used    History  Alcohol Use  . Yes    Comment: 2 drinks a day 2-3 times per week.    Family History  Problem Relation Age of Onset  . Coronary artery disease Father     had bypass in his 104's  . Heart disease Father   . Heart attack Father     Review of Systems:   As noted in history of present illness.  All other systems were reviewed and are negative.   Physical Exam:   BP 124/80  mmHg  Pulse 60  Ht 6' (1.829 m)  Wt 78.019 kg (172 lb)  BMI 23.32 kg/m2 The head is normocephalic and atraumatic.  Pupils are equally round and reactive to light.  Sclerae nonicteric.  Conjunctiva is clear.  Oropharynx is unremarkable.    Neck is supple there are no masses.  Thyroid is not enlarged.  There is no lymphadenopathy.  Lungs are clear.  Chest is symmetric.  Heart shows a regular rate and rhythm.  S1 and S2 are normal.  There is no murmur click or gallop.  Abdomen is soft normal bowel sounds.  There is no organomegaly.   Extremities are without edema.  Peripheral pulses are 2+.  Neurologically intact.  Skin is warm and dry.  Laboratory data: ECG today shows normal sinus rhythm with LAFB.  I have personally reviewed and interpreted this study.    Lab Results  Component Value Date   WBC 7.3 07/02/2012   HGB 14.8 07/02/2012   HCT 45.6 07/02/2012   PLT 229.0 07/02/2012   GLUCOSE 80 05/29/2014   CHOL 156 05/29/2014   TRIG 126.0 05/29/2014   HDL 45.70 05/29/2014   LDLCALC 85 05/29/2014   ALT 24 05/29/2014   AST 29 05/29/2014   NA 140 05/29/2014   K 3.9 05/29/2014  CL 104 05/29/2014   CREATININE 0.9 05/29/2014   BUN 10 05/29/2014   CO2 30 05/29/2014   INR 1.06 05/13/2012     Assessment / Plan: 1. Coronary disease with remote stenting of the left circumflex coronary with a drug-eluting stent in 2004. Last Myoview in 2015 showed no ischemia. He is asymptomatic. We will continue with baby aspirin daily. Follow up in one year.  2. History of GI bleed secondary to antral ulcer. Now resolved.  3. Hypercholesterolemia- on Lipitor. Will schedule for fasting lab work.

## 2016-02-23 NOTE — Patient Instructions (Signed)
Continue your current therapy  We will schedule you for fasting lab work

## 2016-02-23 NOTE — Addendum Note (Signed)
Addended by: Claude Manges on: 02/23/2016 03:59 PM   Modules accepted: Orders

## 2016-02-25 LAB — COMPREHENSIVE METABOLIC PANEL
ALBUMIN: 4.2 g/dL (ref 3.6–5.1)
ALT: 17 U/L (ref 9–46)
AST: 25 U/L (ref 10–35)
Alkaline Phosphatase: 74 U/L (ref 40–115)
BILIRUBIN TOTAL: 0.7 mg/dL (ref 0.2–1.2)
BUN: 12 mg/dL (ref 7–25)
CALCIUM: 9.6 mg/dL (ref 8.6–10.3)
CO2: 27 mmol/L (ref 20–31)
Chloride: 105 mmol/L (ref 98–110)
Creat: 0.95 mg/dL (ref 0.70–1.25)
Glucose, Bld: 93 mg/dL (ref 65–99)
POTASSIUM: 4.5 mmol/L (ref 3.5–5.3)
Sodium: 140 mmol/L (ref 135–146)
Total Protein: 6.5 g/dL (ref 6.1–8.1)

## 2016-02-25 LAB — LIPID PANEL
CHOL/HDL RATIO: 3.1 ratio (ref <=5.0)
CHOLESTEROL: 153 mg/dL (ref 125–200)
HDL: 50 mg/dL (ref >=40)
LDL Cholesterol: 84 mg/dL (ref <130)
TRIGLYCERIDES: 93 mg/dL (ref <150)
VLDL: 19 mg/dL (ref <30)

## 2016-03-20 ENCOUNTER — Ambulatory Visit: Payer: Self-pay | Admitting: Cardiology

## 2016-09-02 ENCOUNTER — Other Ambulatory Visit: Payer: Self-pay | Admitting: Cardiology

## 2016-09-04 NOTE — Telephone Encounter (Signed)
Rx request sent to pharmacy.  

## 2017-02-21 ENCOUNTER — Telehealth: Payer: Self-pay | Admitting: Cardiology

## 2017-02-21 NOTE — Telephone Encounter (Signed)
New message   *STAT* If patient is at the pharmacy, call can be transferred to refill team.   1. Which medications need to be refilled? (please list name of each medication and dose if known) atorvastatin (LIPITOR) 40 MG tablet  2. Which pharmacy/location (including street and city if local pharmacy) is medication to be sent to? [facility] [place]  3. Do they need a 30 day or 90 day supply? 90 day supply   July 23rd appt with Dr. Martinique

## 2017-02-22 MED ORDER — ATORVASTATIN CALCIUM 40 MG PO TABS: ORAL_TABLET | ORAL | 0 refills | Status: DC

## 2017-04-25 NOTE — Progress Notes (Signed)
Subjective:   Ricardo Arnold comes in today for follow up CAD.  He has a history of a lateral myocardial infarction in March 2004 treated with a 3.0 x 18 mm Cypher stent. Followup catheterization in 2004 showed persistent patency of the stented segment but mild to moderate diffuse coronary atherosclerosis. He had followup nuclear stress test in 2009, 2012, and August 2015 which showed a fixed lateral wall defect area and there was no ischemia. Ejection fraction was 50%. He had an upper GI bleed in August of 2013 and Plavix was discontinued.  No further bleeding.  On follow up today he denies any chest pain or shortness of breath. He does exercise regularly but admits he hasn't been as diligent with this. He feels he is losing some muscle strenght. He is still working.     Current Outpatient Prescriptions  Medication Sig Dispense Refill  . aspirin 81 MG EC tablet Take 1 tablet (81 mg total) by mouth daily. Swallow whole.    Marland Kitchen atorvastatin (LIPITOR) 40 MG tablet TAKE 1 TABLET BY MOUTH DAILY AT 6PM 90 tablet 3  . Multiple Vitamins-Minerals (CENTRUM SILVER PO) Take 1 tablet by mouth daily.     No current facility-administered medications for this visit.     No Known Allergies  Patient Active Problem List   Diagnosis Date Noted  . CAD in native artery 06/05/2014  . Gastric ulcer with hemorrhage 05/15/2012  . UGI bleed 05/13/2012  . Anemia, blood loss 05/13/2012  . Ischemic heart disease   . Hyperlipidemia   . History of acute lateral wall MI     History  Smoking Status  . Former Smoker  . Packs/day: 1.00  . Years: 32.00  . Types: Cigarettes  . Quit date: 10/09/2002  Smokeless Tobacco  . Never Used    History  Alcohol Use  . Yes    Comment: 2 drinks a day 2-3 times per week.    Family History  Problem Relation Age of Onset  . Coronary artery disease Father        had bypass in his 37's  . Heart disease Father   . Heart attack Father     Review of Systems:   As noted in history of  present illness.  All other systems were reviewed and are negative.   Physical Exam:   BP (!) 148/72   Pulse 71   Ht 6' (1.829 m)   Wt 172 lb 12.8 oz (78.4 kg)   BMI 23.44 kg/m  The head is normocephalic and atraumatic.  Pupils are equally round and reactive to light.  Sclerae nonicteric.  Conjunctiva is clear.  Oropharynx is unremarkable.    Neck is supple there are no masses.  Thyroid is not enlarged.  There is no lymphadenopathy.  Lungs are clear.  Chest is symmetric.  Heart shows a regular rate and rhythm.  S1 and S2 are normal.  There is no murmur click or gallop.  Abdomen is soft normal bowel sounds.  There is no organomegaly.   Extremities are without edema.  Peripheral pulses are 2+.  Neurologically intact.  Skin is warm and dry.  Laboratory data:   ECG today shows normal sinus rhythm with LAFB. Pulmonary disease pattern. Unchanged from prior.  I have personally reviewed and interpreted this study.    Lab Results  Component Value Date   WBC 7.3 07/02/2012   HGB 14.8 07/02/2012   HCT 45.6 07/02/2012   PLT 229.0 07/02/2012   GLUCOSE 93 02/23/2016  CHOL 153 02/23/2016   TRIG 93 02/23/2016   HDL 50 02/23/2016   LDLCALC 84 02/23/2016   ALT 17 02/23/2016   AST 25 02/23/2016   NA 140 02/23/2016   K 4.5 02/23/2016   CL 105 02/23/2016   CREATININE 0.95 02/23/2016   BUN 12 02/23/2016   CO2 27 02/23/2016   INR 1.06 05/13/2012     Assessment / Plan: 1. Coronary disease with remote stenting of the left circumflex coronary with a drug-eluting stent in 2004. Last Myoview in 2015 showed no ischemia with excellent exercise tolerance and no ST changes. He is asymptomatic. We will continue with baby aspirin daily and statin. Recommend follow up plan old ETT.  2. History of GI bleed in 2013 secondary to antral ulcer. Resolved.  3. Hypercholesterolemia- on Lipitor. Will schedule for fasting lab work.

## 2017-04-30 ENCOUNTER — Encounter: Payer: Self-pay | Admitting: Cardiology

## 2017-04-30 ENCOUNTER — Ambulatory Visit (INDEPENDENT_AMBULATORY_CARE_PROVIDER_SITE_OTHER): Payer: 59 | Admitting: Cardiology

## 2017-04-30 ENCOUNTER — Other Ambulatory Visit: Payer: Self-pay

## 2017-04-30 VITALS — BP 148/72 | HR 71 | Ht 72.0 in | Wt 172.8 lb

## 2017-04-30 DIAGNOSIS — I2510 Atherosclerotic heart disease of native coronary artery without angina pectoris: Secondary | ICD-10-CM

## 2017-04-30 DIAGNOSIS — E7800 Pure hypercholesterolemia, unspecified: Secondary | ICD-10-CM

## 2017-04-30 MED ORDER — ATORVASTATIN CALCIUM 40 MG PO TABS: ORAL_TABLET | ORAL | 3 refills | Status: DC

## 2017-04-30 NOTE — Patient Instructions (Signed)
We will schedule you for fasting lab work and a stress test

## 2017-05-02 ENCOUNTER — Telehealth (HOSPITAL_COMMUNITY): Payer: Self-pay | Admitting: Cardiology

## 2017-05-03 NOTE — Telephone Encounter (Signed)
User: Cherie Dark A Date/time: 05/02/17 8:37 AM  Comment: Called pt and lmsg for him to CB to r/s his ETT appt due to short staffing.   Context:  Outcome: Left Message  Phone number: [phone] Phone Type: Home Phone  Comm. type: Telephone Call type: Outgoing  Contact: Agustin Cree Relation to patient: Self

## 2017-05-15 ENCOUNTER — Inpatient Hospital Stay (HOSPITAL_COMMUNITY): Admission: RE | Admit: 2017-05-15 | Payer: 59 | Source: Ambulatory Visit

## 2017-05-18 LAB — BASIC METABOLIC PANEL
BUN / CREAT RATIO: 11 (ref 10–24)
BUN: 10 mg/dL (ref 8–27)
CO2: 23 mmol/L (ref 20–29)
Calcium: 9.7 mg/dL (ref 8.6–10.2)
Chloride: 103 mmol/L (ref 96–106)
Creatinine, Ser: 0.93 mg/dL (ref 0.76–1.27)
GFR, EST AFRICAN AMERICAN: 100 mL/min/{1.73_m2} (ref >59)
GFR, EST NON AFRICAN AMERICAN: 86 mL/min/{1.73_m2} (ref >59)
Glucose: 84 mg/dL (ref 65–99)
Potassium: 4.3 mmol/L (ref 3.5–5.2)
Sodium: 143 mmol/L (ref 134–144)

## 2017-05-18 LAB — HEPATIC FUNCTION PANEL
ALT: 20 IU/L (ref 0–44)
AST: 27 IU/L (ref 0–40)
Albumin: 4.6 g/dL (ref 3.6–4.8)
Alkaline Phosphatase: 93 IU/L (ref 39–117)
BILIRUBIN, DIRECT: 0.18 mg/dL (ref 0.00–0.40)
Bilirubin Total: 0.6 mg/dL (ref 0.0–1.2)
Total Protein: 6.9 g/dL (ref 6.0–8.5)

## 2017-05-18 LAB — LIPID PANEL
Chol/HDL Ratio: 3 ratio (ref 0.0–5.0)
Cholesterol, Total: 156 mg/dL (ref 100–199)
HDL: 52 mg/dL (ref >39)
LDL CALC: 84 mg/dL (ref 0–99)
TRIGLYCERIDES: 101 mg/dL (ref 0–149)
VLDL CHOLESTEROL CAL: 20 mg/dL (ref 5–40)

## 2017-05-25 ENCOUNTER — Inpatient Hospital Stay (HOSPITAL_COMMUNITY): Admission: RE | Admit: 2017-05-25 | Payer: 59 | Source: Ambulatory Visit

## 2017-05-30 ENCOUNTER — Ambulatory Visit (HOSPITAL_COMMUNITY)
Admission: RE | Admit: 2017-05-30 | Discharge: 2017-05-30 | Disposition: A | Discharge status: Home / Self Care | Payer: 59 | Source: Ambulatory Visit | Attending: Internal Medicine | Admitting: Internal Medicine

## 2017-05-30 DIAGNOSIS — I2510 Atherosclerotic heart disease of native coronary artery without angina pectoris: Secondary | ICD-10-CM | POA: Diagnosis not present

## 2017-05-30 DIAGNOSIS — E7800 Pure hypercholesterolemia, unspecified: Secondary | ICD-10-CM | POA: Diagnosis not present

## 2017-05-30 LAB — EXERCISE TOLERANCE TEST
CHL CUP MPHR: 156 {beats}/min
CHL CUP RESTING HR STRESS: 73 {beats}/min
CHL RATE OF PERCEIVED EXERTION: 16
CSEPEDS: 15 s
CSEPPHR: 162 {beats}/min
Estimated workload: 13.8 METS
Exercise duration (min): 12 min
Percent HR: 103 %

## 2018-03-20 ENCOUNTER — Telehealth: Payer: Self-pay | Admitting: Cardiology

## 2018-03-20 DIAGNOSIS — E7800 Pure hypercholesterolemia, unspecified: Secondary | ICD-10-CM

## 2018-03-20 NOTE — Telephone Encounter (Signed)
Left a message to call back.

## 2018-03-20 NOTE — Telephone Encounter (Signed)
New message  Patient calling to request annual lab order and any other additional testing order

## 2018-03-22 NOTE — Telephone Encounter (Signed)
Unable to reach pt or leave a message. Lab orders mailed to the pt

## 2018-04-26 LAB — COMPREHENSIVE METABOLIC PANEL
A/G RATIO: 2.1 (ref 1.2–2.2)
ALT: 20 IU/L (ref 0–44)
AST: 22 IU/L (ref 0–40)
Albumin: 4.4 g/dL (ref 3.6–4.8)
Alkaline Phosphatase: 76 IU/L (ref 39–117)
BILIRUBIN TOTAL: 0.6 mg/dL (ref 0.0–1.2)
BUN/Creatinine Ratio: 13 (ref 10–24)
BUN: 12 mg/dL (ref 8–27)
CALCIUM: 9.4 mg/dL (ref 8.6–10.2)
CHLORIDE: 103 mmol/L (ref 96–106)
CO2: 23 mmol/L (ref 20–29)
Creatinine, Ser: 0.92 mg/dL (ref 0.76–1.27)
GFR, EST AFRICAN AMERICAN: 101 mL/min/{1.73_m2} (ref >59)
GFR, EST NON AFRICAN AMERICAN: 87 mL/min/{1.73_m2} (ref >59)
GLOBULIN, TOTAL: 2.1 g/dL (ref 1.5–4.5)
Glucose: 77 mg/dL (ref 65–99)
POTASSIUM: 4.3 mmol/L (ref 3.5–5.2)
SODIUM: 141 mmol/L (ref 134–144)
Total Protein: 6.5 g/dL (ref 6.0–8.5)

## 2018-04-26 LAB — LIPID PANEL
CHOL/HDL RATIO: 3.5 ratio (ref 0.0–5.0)
Cholesterol, Total: 167 mg/dL (ref 100–199)
HDL: 48 mg/dL (ref >39)
LDL Calculated: 93 mg/dL (ref 0–99)
Triglycerides: 128 mg/dL (ref 0–149)
VLDL Cholesterol Cal: 26 mg/dL (ref 5–40)

## 2018-04-30 ENCOUNTER — Encounter: Payer: Self-pay | Admitting: Physician Assistant

## 2018-04-30 ENCOUNTER — Ambulatory Visit: Payer: 59 | Admitting: Physician Assistant

## 2018-04-30 VITALS — BP 116/78 | HR 63 | Ht 72.0 in | Wt 176.8 lb

## 2018-04-30 DIAGNOSIS — E785 Hyperlipidemia, unspecified: Secondary | ICD-10-CM | POA: Diagnosis not present

## 2018-04-30 DIAGNOSIS — I2510 Atherosclerotic heart disease of native coronary artery without angina pectoris: Secondary | ICD-10-CM | POA: Diagnosis not present

## 2018-04-30 NOTE — Patient Instructions (Addendum)
Medication Instructions:   Your physician recommends that you continue on your current medications as directed. Please refer to the Current Medication list given to you today.   If you need a refill on your cardiac medications before your next appointment, please call your pharmacy.  Labwork:  RETURN LIPIDS IN 6 MONTHS   Testing/Procedures: NONE ORDERED  TODAY    Follow-Up: Your physician wants you to follow-up in: ONE YEAR WITH  Martinique You will receive a reminder letter in the mail two months in advance. If you don't receive a letter, please call our office to schedule the follow-up appointment.     Any Other Special Instructions Will Be Listed Below (If Applicable).  YOU HAVE BEEN RECOMMENDED TO DIET AND EXERCISE FOR  THE NEXT 6 MONTHS

## 2018-04-30 NOTE — Progress Notes (Signed)
Cardiology Office Note    Date:  04/30/2018   ID:  Ricardo Arnold, DOB 1951/02/16, MRN [id number]  PCP:  Patient, No Pcp Per  Cardiologist:  Dr. Martinique  Chief Complaint  Patient presents with  . Follow-up    seen for Dr. Martinique. Annual visit    History of Present Illness:  Ricardo Arnold is a 67 y.o. male with PMH of CAD and HLD.  Patient had a history of lateral MI in March 2004 treated with Cypher 3.0 x 18 mm DES.  Cardiac catheterization in 2004 showed patent stent but mild to moderate diffuse coronary artery atherosclerosis.  Follow-up nuclear stress test in August 2015 showed fixed lateral defect with no ischemia.  EF 50%.  Plavix was discontinued in August 2013 after upper GI bleed.  Patient was last seen by Dr. Martinique in July 2018, follow-up ETT obtained on 05/30/2017 showed no ST segment deviation during stress, overall considered normal ETT.  Recent blood work obtained on 04/26/2018 showed total cholesterol 167, HDL 48, LDL 93, triglyceride 128.  Patient presents today for cardiology office visit.  Due to the recent heat, he has not been able to ride his bicycle.  Previously he was able to ride bicycles up to 8 miles a day.  At his workplace, he does take the stairs instead of the elevators.  He says he can easily walk 2 flights of stairs without any chest discomfort or shortness of breath.  Otherwise he has no lower extremity edema, orthopnea or PND.  He has been compliant with his medication.  We discussed the potentially increase the Lipitor to 80 mg daily, patient wished to do a trial of diet and exercise first.  We will repeat fasting lipid panel in 6 months.  If LDL still greater than 70, then we will increase the Lipitor to 80 mg daily.   Past Medical History:  Diagnosis Date  . Anemia   . Antral ulcer   . Anxiety   . Blood transfusion    9/13 with bleeding ulcer  . Cancer ([place]) 11/12   skin cancer  . Coronary atherosclerosis   . Duodenitis   . Hiatal hernia   . History  of acute lateral wall MI 2004   treated with a 3.0 x 18 mm Cypher stent/   . Hyperlipidemia   . Hypokinesia    with EF of 49%  /by  cardiolite study  . Ischemic heart disease   . Upper GI bleed     Past Surgical History:  Procedure Laterality Date  . CARDIAC CATHETERIZATION  03/09/2003   EF  50%   . cardiac stents     2004  . ESOPHAGOGASTRODUODENOSCOPY  05/15/2012   Procedure: ESOPHAGOGASTRODUODENOSCOPY (EGD);  Surgeon: Lafayette Dragon, MD;  Location: [facility];  Service: Endoscopy;  Laterality: N/A;  . SKIN GRAFT  2012  . TONSILLECTOMY      Current Medications: Outpatient Medications Prior to Visit  Medication Sig Dispense Refill  . aspirin 81 MG EC tablet Take 1 tablet (81 mg total) by mouth daily. Swallow whole.    Marland Kitchen atorvastatin (LIPITOR) 40 MG tablet TAKE 1 TABLET BY MOUTH DAILY AT 6PM 90 tablet 3  . Multiple Vitamins-Minerals (CENTRUM SILVER PO) Take 1 tablet by mouth daily.     No facility-administered medications prior to visit.      Allergies:   Patient has no known allergies.   Social History   Socioeconomic History  . Marital status: Married    Spouse  name: Not on file  . Number of children: 1  . Years of education: Not on file  . Highest education level: Not on file  Occupational History  . Occupation: Lobbyist: [employer]  . Financial resource strain: Not on file  . Food insecurity:    Worry: Not on file    Inability: Not on file  . Transportation needs:    Medical: Not on file    Non-medical: Not on file  Tobacco Use  . Smoking status: Former Smoker    Packs/day: 1.00    Years: 32.00    Pack years: 32.00    Types: Cigarettes    Last attempt to quit: 10/09/2002    Years since quitting: 15.5  . Smokeless tobacco: Never Used  Substance and Sexual Activity  . Alcohol use: Yes    Comment: 2 drinks a day 2-3 times per week.  . Drug use: No  . Sexual activity: Not on file  Lifestyle  . Physical activity:    Days per week:  Not on file    Minutes per session: Not on file  . Stress: Not on file  Relationships  . Social connections:    Talks on phone: Not on file    Gets together: Not on file    Attends religious service: Not on file    Active member of club or organization: Not on file    Attends meetings of clubs or organizations: Not on file    Relationship status: Not on file  Other Topics Concern  . Not on file  Social History Narrative  . Not on file     Family History:  The patient's family history includes Coronary artery disease in his father; Heart attack in his father; Heart disease in his father.   ROS:   Please see the history of present illness.    ROS All other systems reviewed and are negative.   PHYSICAL EXAM:   VS:  BP 116/78   Pulse 63   Ht 6' (1.829 m)   Wt 176 lb 12.8 oz (80.2 kg)   BMI 23.98 kg/m    GEN: Well nourished, well developed, in no acute distress  HEENT: normal  Neck: no JVD, carotid bruits, or masses Cardiac: RRR; no murmurs, rubs, or gallops,no edema  Respiratory:  clear to auscultation bilaterally, normal work of breathing GI: soft, nontender, nondistended, + BS MS: no deformity or atrophy  Skin: warm and dry, no rash Neuro:  Alert and Oriented x 3, Strength and sensation are intact Psych: euthymic mood, full affect  Wt Readings from Last 3 Encounters:  04/30/18 176 lb 12.8 oz (80.2 kg)  04/30/17 172 lb 12.8 oz (78.4 kg)  02/23/16 172 lb (78 kg)      Studies/Labs Reviewed:   EKG:  EKG is ordered today.  The ekg ordered today demonstrates normal sinus rhythm, no significant ST-T wave changes.  Unchanged compared to 2018 EKG.  Recent Labs: 04/26/2018: ALT 20; BUN 12; Creatinine, Ser 0.92; Potassium 4.3; Sodium 141   Lipid Panel    Component Value Date/Time   CHOL 167 04/26/2018 0850   TRIG 128 04/26/2018 0850   HDL 48 04/26/2018 0850   CHOLHDL 3.5 04/26/2018 0850   CHOLHDL 3.1 02/23/2016 0901   VLDL 19 02/23/2016 0901   LDLCALC 93 04/26/2018  0850    Additional studies/ records that were reviewed today include:   ETT 05/30/2017 Study Highlights     Blood pressure  demonstrated a normal response to exercise.  There was no ST segment deviation noted during stress.   Normal ETT No ischemia     ASSESSMENT:    1. Coronary artery disease involving native coronary artery of native heart without angina pectoris   2. Hyperlipidemia, unspecified hyperlipidemia type      PLAN:  In order of problems listed above:  1. CAD: Continue aspirin and Lipitor.  Blood pressure very well controlled on no medication.  Patient is able to climb at least 2 flights of stairs without any chest discomfort or shortness of breath  2. Hyperlipidemia: Continue on 40 mg Lipitor for now.  Recent LDL has been elevated, patient says he has not been able to write his bicycles due to the recent heat.  I recommended diet and exercise, will bring the patient back in 6 months for fasting lipid panel.  Otherwise he can follow-up with Dr. Martinique in one year.    Medication Adjustments/Labs and Tests Ordered: Current medicines are reviewed at length with the patient today.  Concerns regarding medicines are outlined above.  Medication changes, Labs and Tests ordered today are listed in the Patient Instructions below. Patient Instructions  Medication Instructions:   Your physician recommends that you continue on your current medications as directed. Please refer to the Current Medication list given to you today.   If you need a refill on your cardiac medications before your next appointment, please call your pharmacy.  Labwork:  RETURN LIPIDS IN 6 MONTHS   Testing/Procedures: NONE ORDERED  TODAY    Follow-Up: Your physician wants you to follow-up in: ONE YEAR WITH  Martinique You will receive a reminder letter in the mail two months in advance. If you don't receive a letter, please call our office to schedule the follow-up appointment.     Any Other  Special Instructions Will Be Listed Below (If Applicable).  YOU HAVE BEEN RECOMMENDED TO DIET AND EXERCISE FOR  THE NEXT 6 MONTHS                                                                                                                                                   Signed, Almyra Deforest, [place]  04/30/2018 9:22 AM    [address], [place], [place]  [address] Phone: [phone]; Fax: [phone]

## 2018-05-24 ENCOUNTER — Telehealth: Payer: Self-pay | Admitting: Cardiology

## 2018-05-24 NOTE — Telephone Encounter (Signed)
Patient returned phone call, he states he was called about increasing his dose. I looked at last lab notes from 04/26/18, another nurse had called on 05/14/18 about increasing lipitor to 80mg , but previous OV with Almyra Deforest, PA on 04/30/18 stated he could continue with 40mg  but did recommend he try to diet and exercise and retest in 6 months which will be in January of 2020. Patient verbalized understanding.

## 2018-05-24 NOTE — Telephone Encounter (Signed)
Returned pt call.[email or web address] 

## 2018-05-24 NOTE — Telephone Encounter (Signed)
New Message:  Pt returning a call to increase dosage:   atorvastatin (LIPITOR) 40 MG tablet

## 2018-05-24 NOTE — Telephone Encounter (Signed)
New Message:     Pt is returning a call and pt states you can leave a detailed msg

## 2018-05-30 ENCOUNTER — Other Ambulatory Visit: Payer: Self-pay

## 2018-05-30 ENCOUNTER — Telehealth: Payer: Self-pay | Admitting: Cardiology

## 2018-05-30 DIAGNOSIS — E785 Hyperlipidemia, unspecified: Secondary | ICD-10-CM

## 2018-05-30 DIAGNOSIS — I2510 Atherosclerotic heart disease of native coronary artery without angina pectoris: Secondary | ICD-10-CM

## 2018-05-30 MED ORDER — ATORVASTATIN CALCIUM 80 MG PO TABS: 80.0000 mg | ORAL_TABLET | Freq: Every day | ORAL | 3 refills | Status: DC

## 2018-05-30 NOTE — Telephone Encounter (Signed)
Follow up:   Patient returning call. Please call Patient back

## 2018-05-30 NOTE — Telephone Encounter (Signed)
Returned call to patient lab results given. 

## 2018-07-10 ENCOUNTER — Other Ambulatory Visit: Payer: Self-pay | Admitting: Cardiology

## 2018-09-26 ENCOUNTER — Telehealth: Payer: Self-pay | Admitting: Cardiology

## 2018-09-26 NOTE — Telephone Encounter (Signed)
New message    Pt is calling saying he was told to come in on a certain day to have his labs drawn and he forgot. He said can he come in tomorrow or Monday and have labs drawn? Please call.

## 2018-09-26 NOTE — Telephone Encounter (Signed)
Patient forgot to have labs drawn in November.Will have done this next week

## 2018-09-30 LAB — LIPID PANEL W/O CHOL/HDL RATIO
Cholesterol, Total: 136 mg/dL (ref 100–199)
HDL: 47 mg/dL (ref >39)
LDL Calculated: 74 mg/dL (ref 0–99)
TRIGLYCERIDES: 75 mg/dL (ref 0–149)
VLDL Cholesterol Cal: 15 mg/dL (ref 5–40)

## 2018-09-30 LAB — HEPATIC FUNCTION PANEL
ALK PHOS: 93 IU/L (ref 39–117)
ALT: 22 IU/L (ref 0–44)
AST: 22 IU/L (ref 0–40)
Albumin: 4.1 g/dL (ref 3.6–4.8)
BILIRUBIN, DIRECT: 0.15 mg/dL (ref 0.00–0.40)
Bilirubin Total: 0.4 mg/dL (ref 0.0–1.2)
Total Protein: 6.3 g/dL (ref 6.0–8.5)

## 2018-10-10 ENCOUNTER — Telehealth: Payer: Self-pay | Admitting: Cardiology

## 2018-10-10 NOTE — Telephone Encounter (Signed)
New message     Pt is returning call to nurse to get lab results. Please call

## 2018-10-10 NOTE — Telephone Encounter (Signed)
Lab results reviewed with patient and patient verbalized understanding.

## 2019-07-01 ENCOUNTER — Other Ambulatory Visit: Payer: Self-pay

## 2019-07-01 MED ORDER — ATORVASTATIN CALCIUM 80 MG PO TABS: 80.0000 mg | ORAL_TABLET | Freq: Every day | ORAL | 0 refills | Status: DC

## 2019-08-12 ENCOUNTER — Other Ambulatory Visit: Payer: Self-pay | Admitting: Cardiology

## 2019-09-06 ENCOUNTER — Other Ambulatory Visit: Payer: Self-pay | Admitting: Cardiology

## 2019-11-30 NOTE — Progress Notes (Signed)
Cardiology Office Note    Date:  12/04/2019   ID:  Ricardo Arnold, DOB 02-Jun-1951, MRN [id number]  PCP:  Patient, No Pcp Per  Cardiologist:  Dr. Syona Wroblewski Martinique  Chief Complaint  Patient presents with  . Coronary Artery Disease    History of Present Illness:  Ricardo Arnold is a 69 y.o. male with PMH of CAD and HLD.  Patient had a history of lateral MI in March 2004 treated with Cypher 3.0 x 18 mm DES of the LCx.  Cardiac catheterization in 2004 showed patent stent but mild to moderate diffuse coronary artery atherosclerosis.  Follow-up nuclear stress test in August 2015 showed fixed lateral defect with no ischemia.  EF 50%.  Plavix was discontinued in August 2013 after upper GI bleed.  Follow-up ETT obtained on 05/30/2017 showed no ST segment deviation during stress, overall considered normal ETT.    On follow up today he is doing well. He denies any chest pain or SOB. Hasn't been as active this winter and working from home a lot so has gained some weight. Had trouble refilling his lipitor so has only been taking 1/2 tablet. When the weather is better he rides his bike and [place].    Past Medical History:  Diagnosis Date  . Anemia   . Antral ulcer   . Anxiety   . Blood transfusion    9/13 with bleeding ulcer  . Cancer ([place]) 11/12   skin cancer  . Coronary atherosclerosis   . Duodenitis   . Hiatal hernia   . History of acute lateral wall MI 2004   treated with a 3.0 x 18 mm Cypher stent/   . Hyperlipidemia   . Hypokinesia    with EF of 49%  /by  cardiolite study  . Ischemic heart disease   . Upper GI bleed     Past Surgical History:  Procedure Laterality Date  . CARDIAC CATHETERIZATION  03/09/2003   EF  50%   . cardiac stents     2004  . ESOPHAGOGASTRODUODENOSCOPY  05/15/2012   Procedure: ESOPHAGOGASTRODUODENOSCOPY (EGD);  Surgeon: Lafayette Dragon, MD;  Location: [facility];  Service: Endoscopy;  Laterality: N/A;  . SKIN GRAFT  2012  . TONSILLECTOMY      Current  Medications: Outpatient Medications Prior to Visit  Medication Sig Dispense Refill  . aspirin 81 MG EC tablet Take 1 tablet (81 mg total) by mouth daily. Swallow whole.    . Multiple Vitamins-Minerals (CENTRUM SILVER PO) Take 1 tablet by mouth daily.    Marland Kitchen atorvastatin (LIPITOR) 80 MG tablet Take 1 tablet (80 mg total) by mouth daily at 6 PM. 30 tablet 0   No facility-administered medications prior to visit.     Allergies:   Patient has no known allergies.   Social History   Socioeconomic History  . Marital status: Married    Spouse name: Not on file  . Number of children: 1  . Years of education: Not on file  . Highest education level: Not on file  Occupational History  . Occupation: Lobbyist: [employer]  . Smoking status: Former Smoker    Packs/day: 1.00    Years: 32.00    Pack years: 32.00    Types: Cigarettes    Quit date: 10/09/2002    Years since quitting: 17.1  . Smokeless tobacco: Never Used  Substance and Sexual Activity  . Alcohol use: Yes    Comment: 2 drinks a day 2-3  times per week.  . Drug use: No  . Sexual activity: Not on file  Other Topics Concern  . Not on file  Social History Narrative  . Not on file   Social Determinants of Health   Financial Resource Strain:   . Difficulty of Paying Living Expenses: Not on file  Food Insecurity:   . Worried About Charity fundraiser in the Last Year: Not on file  . Ran Out of Food in the Last Year: Not on file  Transportation Needs:   . Lack of Transportation (Medical): Not on file  . Lack of Transportation (Non-Medical): Not on file  Physical Activity:   . Days of Exercise per Week: Not on file  . Minutes of Exercise per Session: Not on file  Stress:   . Feeling of Stress : Not on file  Social Connections:   . Frequency of Communication with Friends and Family: Not on file  . Frequency of Social Gatherings with Friends and Family: Not on file  . Attends Religious Services: Not on  file  . Active Member of Clubs or Organizations: Not on file  . Attends Archivist Meetings: Not on file  . Marital Status: Not on file     Family History:  The patient's family history includes Coronary artery disease in his father; Heart attack in his father; Heart disease in his father.   ROS:   Please see the history of present illness.    ROS All other systems reviewed and are negative.   PHYSICAL EXAM:   VS:  BP 114/72   Pulse 82   Ht 6' (1.829 m)   Wt 182 lb 8 oz (82.8 kg)   BMI 24.75 kg/m    GEN: Well nourished, well developed, in no acute distress  HEENT: normal  Neck: no JVD, carotid bruits, or masses Cardiac: RRR; no murmurs, rubs, or gallops,no edema  Respiratory:  clear to auscultation bilaterally, normal work of breathing GI: soft, nontender, nondistended, + BS MS: no deformity or atrophy  Skin: warm and dry, no rash Neuro:  Alert and Oriented x 3, Strength and sensation are intact Psych: euthymic mood, full affect  Wt Readings from Last 3 Encounters:  12/04/19 182 lb 8 oz (82.8 kg)  04/30/18 176 lb 12.8 oz (80.2 kg)  04/30/17 172 lb 12.8 oz (78.4 kg)      Studies/Labs Reviewed:   EKG:  EKG is ordered today.  The ekg ordered today demonstrates normal sinus rhythm, rate 82. LAFB. No change from prior. I have personally reviewed and interpreted this study.   Recent Labs: No results found for requested labs within last 8760 hours.   Lipid Panel    Component Value Date/Time   CHOL 136 09/30/2018 1021   TRIG 75 09/30/2018 1021   HDL 47 09/30/2018 1021   CHOLHDL 3.5 04/26/2018 0850   CHOLHDL 3.1 02/23/2016 0901   VLDL 19 02/23/2016 0901   LDLCALC 74 09/30/2018 1021    Additional studies/ records that were reviewed today include:   ETT 05/30/2017 Study Highlights     Blood pressure demonstrated a normal response to exercise.  There was no ST segment deviation noted during stress.   Normal ETT No ischemia     ASSESSMENT:    1.  CAD in native artery   2. Hyperlipidemia, unspecified hyperlipidemia type      PLAN:  In order of problems listed above:  1. CAD: Continue aspirin and Lipitor.  He is asymptomatic.  2. Hyperlipidemia: refilled lipitor to 80 mg. Will arrange follow up fasting lab work in 2 months once he is back on appropriate dose. Encourage increased aerobic activity and weight control .    Medication Adjustments/Labs and Tests Ordered: Current medicines are reviewed at length with the patient today.  Concerns regarding medicines are outlined above.  Medication changes, Labs and Tests ordered today are listed in the Patient Instructions below. There are no Patient Instructions on file for this visit.  Signed, Yisroel Mullendore Martinique, MD  12/04/2019 8:25 AM    [facility]

## 2019-12-04 ENCOUNTER — Ambulatory Visit: Payer: 59 | Admitting: Cardiology

## 2019-12-04 ENCOUNTER — Encounter: Payer: Self-pay | Admitting: Cardiology

## 2019-12-04 ENCOUNTER — Other Ambulatory Visit: Payer: Self-pay

## 2019-12-04 VITALS — BP 114/72 | HR 82 | Ht 72.0 in | Wt 182.5 lb

## 2019-12-04 DIAGNOSIS — E785 Hyperlipidemia, unspecified: Secondary | ICD-10-CM

## 2019-12-04 DIAGNOSIS — I2510 Atherosclerotic heart disease of native coronary artery without angina pectoris: Secondary | ICD-10-CM | POA: Diagnosis not present

## 2019-12-04 MED ORDER — ATORVASTATIN CALCIUM 80 MG PO TABS: 80.0000 mg | ORAL_TABLET | Freq: Every day | ORAL | 3 refills | Status: DC

## 2020-02-19 LAB — HEPATIC FUNCTION PANEL
ALT: 21 IU/L (ref 0–44)
AST: 28 IU/L (ref 0–40)
Albumin: 4.6 g/dL (ref 3.8–4.8)
Alkaline Phosphatase: 94 IU/L (ref 39–117)
Bilirubin Total: 0.7 mg/dL (ref 0.0–1.2)
Bilirubin, Direct: 0.17 mg/dL (ref 0.00–0.40)
Total Protein: 6.9 g/dL (ref 6.0–8.5)

## 2020-02-19 LAB — LIPID PANEL
Chol/HDL Ratio: 3.2 ratio (ref 0.0–5.0)
Cholesterol, Total: 151 mg/dL (ref 100–199)
HDL: 47 mg/dL (ref >39)
LDL Chol Calc (NIH): 86 mg/dL (ref 0–99)
Triglycerides: 100 mg/dL (ref 0–149)
VLDL Cholesterol Cal: 18 mg/dL (ref 5–40)

## 2020-02-19 LAB — BASIC METABOLIC PANEL
BUN/Creatinine Ratio: 13 (ref 10–24)
BUN: 13 mg/dL (ref 8–27)
CO2: 24 mmol/L (ref 20–29)
Calcium: 9.7 mg/dL (ref 8.6–10.2)
Chloride: 104 mmol/L (ref 96–106)
Creatinine, Ser: 0.99 mg/dL (ref 0.76–1.27)
GFR calc Af Amer: 91 mL/min/{1.73_m2} (ref >59)
GFR calc non Af Amer: 78 mL/min/{1.73_m2} (ref >59)
Glucose: 90 mg/dL (ref 65–99)
Potassium: 4.9 mmol/L (ref 3.5–5.2)
Sodium: 141 mmol/L (ref 134–144)

## 2020-12-24 NOTE — Progress Notes (Signed)
Cardiology Office Note    Date:  12/27/2020   ID:  Ricardo Arnold, DOB 1950-10-19, MRN [id number]  PCP:  Pcp, No  Cardiologist:  Dr. Earlyne Feeser Martinique  Chief Complaint  Patient presents with  . Coronary Artery Disease    History of Present Illness:  Ricardo Arnold is a 70 y.o. male with PMH of CAD and HLD.  Patient had a history of lateral MI in March 2004 treated with Cypher 3.0 x 18 mm DES of the LCx.  Cardiac catheterization in 2004 showed patent stent but mild to moderate diffuse coronary artery atherosclerosis.  Follow-up nuclear stress test in August 2015 showed fixed lateral defect with no ischemia.  EF 50%.  Plavix was discontinued in August 2013 after upper GI bleed.  Follow-up ETT obtained on 05/30/2017 showed no ST segment deviation during stress, overall considered normal ETT.    On follow up today he is doing well. He denies any chest pain or SOB. He is walking 3 miles per day. Not doing as much on the bike. He eats a healthy diet. No complaints.    Past Medical History:  Diagnosis Date  . Anemia   . Antral ulcer   . Anxiety   . Blood transfusion    9/13 with bleeding ulcer  . Cancer ([place]) 11/12   skin cancer  . Coronary atherosclerosis   . Duodenitis   . Hiatal hernia   . History of acute lateral wall MI 2004   treated with a 3.0 x 18 mm Cypher stent/   . Hyperlipidemia   . Hypokinesia    with EF of 49%  /by  cardiolite study  . Ischemic heart disease   . Upper GI bleed     Past Surgical History:  Procedure Laterality Date  . CARDIAC CATHETERIZATION  03/09/2003   EF  50%   . cardiac stents     2004  . ESOPHAGOGASTRODUODENOSCOPY  05/15/2012   Procedure: ESOPHAGOGASTRODUODENOSCOPY (EGD);  Surgeon: Lafayette Dragon, MD;  Location: [facility];  Service: Endoscopy;  Laterality: N/A;  . SKIN GRAFT  2012  . TONSILLECTOMY      Current Medications: Outpatient Medications Prior to Visit  Medication Sig Dispense Refill  . aspirin 81 MG EC tablet Take 1 tablet (81 mg  total) by mouth daily. Swallow whole.    Marland Kitchen atorvastatin (LIPITOR) 80 MG tablet Take 1 tablet (80 mg total) by mouth daily at 6 PM. 90 tablet 3  . Multiple Vitamins-Minerals (CENTRUM SILVER PO) Take 1 tablet by mouth daily.     No facility-administered medications prior to visit.     Allergies:   Patient has no known allergies.   Social History   Socioeconomic History  . Marital status: Married    Spouse name: Not on file  . Number of children: 1  . Years of education: Not on file  . Highest education level: Not on file  Occupational History  . Occupation: Lobbyist: [employer]  . Smoking status: Former Smoker    Packs/day: 1.00    Years: 32.00    Pack years: 32.00    Types: Cigarettes    Quit date: 10/09/2002    Years since quitting: 18.2  . Smokeless tobacco: Never Used  Vaping Use  . Vaping Use: Never used  Substance and Sexual Activity  . Alcohol use: Yes    Comment: 2 drinks a day 2-3 times per week.  . Drug use: No  . Sexual activity:  Not on file  Other Topics Concern  . Not on file  Social History Narrative  . Not on file   Social Determinants of Health   Financial Resource Strain: Not on file  Food Insecurity: Not on file  Transportation Needs: Not on file  Physical Activity: Not on file  Stress: Not on file  Social Connections: Not on file     Family History:  The patient's family history includes Coronary artery disease in his father; Heart attack in his father; Heart disease in his father.   ROS:   Please see the history of present illness.    ROS All other systems reviewed and are negative.   PHYSICAL EXAM:   VS:  BP 129/80   Pulse 68   Ht 6' (1.829 m)   Wt 180 lb (81.6 kg)   SpO2 98%   BMI 24.41 kg/m    GEN: Well nourished, well developed, in no acute distress  HEENT: normal  Neck: no JVD, carotid bruits, or masses Cardiac: RRR; no murmurs, rubs, or gallops,no edema  Respiratory:  clear to auscultation bilaterally,  normal work of breathing GI: soft, nontender, nondistended, + BS MS: no deformity or atrophy  Skin: warm and dry, no rash Neuro:  Alert and Oriented x 3, Strength and sensation are intact Psych: euthymic mood, full affect  Wt Readings from Last 3 Encounters:  12/27/20 180 lb (81.6 kg)  12/04/19 182 lb 8 oz (82.8 kg)  04/30/18 176 lb 12.8 oz (80.2 kg)      Studies/Labs Reviewed:   EKG:  EKG is ordered today.  The ekg ordered today demonstrates normal sinus rhythm, rate 68. LAFB. No change from prior. I have personally reviewed and interpreted this study.   Recent Labs: 02/19/2020: ALT 21; BUN 13; Creatinine, Ser 0.99; Potassium 4.9; Sodium 141   Lipid Panel    Component Value Date/Time   CHOL 151 02/19/2020 0912   TRIG 100 02/19/2020 0912   HDL 47 02/19/2020 0912   CHOLHDL 3.2 02/19/2020 0912   CHOLHDL 3.1 02/23/2016 0901   VLDL 19 02/23/2016 0901   LDLCALC 86 02/19/2020 0912    Additional studies/ records that were reviewed today include:   ETT 05/30/2017 Study Highlights     Blood pressure demonstrated a normal response to exercise.  There was no ST segment deviation noted during stress.   Normal ETT No ischemia     ASSESSMENT:    No diagnosis found.   PLAN:  In order of problems listed above:  1. CAD: Continue aspirin and Lipitor.  He is asymptomatic.  2. Hyperlipidemia: last LDL 83. Goal < 70. On lipitor 80 mg. Will update labs. If not at goal will consider additional therapy.    Medication Adjustments/Labs and Tests Ordered: Current medicines are reviewed at length with the patient today.  Concerns regarding medicines are outlined above.  Medication changes, Labs and Tests ordered today are listed in the Patient Instructions below. There are no Patient Instructions on file for this visit.  Signed, Janne Faulk Martinique, MD  12/27/2020 5:03 PM    [place]

## 2020-12-27 ENCOUNTER — Other Ambulatory Visit: Payer: Self-pay

## 2020-12-27 ENCOUNTER — Encounter: Payer: Self-pay | Admitting: Cardiology

## 2020-12-27 ENCOUNTER — Ambulatory Visit: Payer: Medicare HMO | Admitting: Cardiology

## 2020-12-27 VITALS — BP 129/80 | HR 68 | Ht 72.0 in | Wt 180.0 lb

## 2020-12-27 DIAGNOSIS — E785 Hyperlipidemia, unspecified: Secondary | ICD-10-CM

## 2020-12-27 DIAGNOSIS — I2510 Atherosclerotic heart disease of native coronary artery without angina pectoris: Secondary | ICD-10-CM | POA: Diagnosis not present

## 2020-12-29 DIAGNOSIS — E785 Hyperlipidemia, unspecified: Secondary | ICD-10-CM | POA: Diagnosis not present

## 2020-12-29 DIAGNOSIS — I2510 Atherosclerotic heart disease of native coronary artery without angina pectoris: Secondary | ICD-10-CM | POA: Diagnosis not present

## 2020-12-29 LAB — COMPREHENSIVE METABOLIC PANEL
ALT: 22 IU/L (ref 0–44)
AST: 29 IU/L (ref 0–40)
Albumin/Globulin Ratio: 1.8 (ref 1.2–2.2)
Albumin: 4.2 g/dL (ref 3.8–4.8)
Alkaline Phosphatase: 84 IU/L (ref 44–121)
BUN/Creatinine Ratio: 14 (ref 10–24)
BUN: 14 mg/dL (ref 8–27)
Bilirubin Total: 0.6 mg/dL (ref 0.0–1.2)
CO2: 22 mmol/L (ref 20–29)
Calcium: 9.7 mg/dL (ref 8.6–10.2)
Chloride: 102 mmol/L (ref 96–106)
Creatinine, Ser: 1.03 mg/dL (ref 0.76–1.27)
Globulin, Total: 2.4 g/dL (ref 1.5–4.5)
Glucose: 88 mg/dL (ref 65–99)
Potassium: 4.3 mmol/L (ref 3.5–5.2)
Sodium: 140 mmol/L (ref 134–144)
Total Protein: 6.6 g/dL (ref 6.0–8.5)
eGFR: 80 mL/min/{1.73_m2} (ref >59)

## 2020-12-29 LAB — LIPID PANEL
Chol/HDL Ratio: 3.4 ratio (ref 0.0–5.0)
Cholesterol, Total: 166 mg/dL (ref 100–199)
HDL: 49 mg/dL (ref >39)
LDL Chol Calc (NIH): 96 mg/dL (ref 0–99)
Triglycerides: 114 mg/dL (ref 0–149)
VLDL Cholesterol Cal: 21 mg/dL (ref 5–40)

## 2021-01-05 ENCOUNTER — Other Ambulatory Visit: Payer: Self-pay

## 2021-01-05 DIAGNOSIS — E785 Hyperlipidemia, unspecified: Secondary | ICD-10-CM

## 2021-01-05 DIAGNOSIS — I2510 Atherosclerotic heart disease of native coronary artery without angina pectoris: Secondary | ICD-10-CM

## 2021-01-05 MED ORDER — EZETIMIBE 10 MG PO TABS: 10.0000 mg | ORAL_TABLET | Freq: Every day | ORAL | 3 refills | Status: DC

## 2021-01-13 ENCOUNTER — Other Ambulatory Visit: Payer: Self-pay | Admitting: Cardiology

## 2022-01-06 ENCOUNTER — Other Ambulatory Visit: Payer: Self-pay | Admitting: Cardiology

## 2022-03-01 ENCOUNTER — Ambulatory Visit: Payer: Medicare HMO | Admitting: Cardiology

## 2022-04-02 NOTE — Progress Notes (Deleted)
Cardiology Office Note    Date:  04/02/2022   ID:  Ricardo Arnold, DOB Feb 18, 1951, MRN [id number]  PCP:  Pcp, No  Cardiologist:  Dr. Masey Scheiber [place]  No chief complaint on file.   History of Present Illness:  Ricardo Arnold is a 71 y.o. male with PMH of CAD and HLD.  Patient had a history of lateral MI in March 2004 treated with Cypher 3.0 x 18 mm DES of the LCx.  Cardiac catheterization in 2004 showed patent stent but mild to moderate diffuse coronary artery atherosclerosis.  Follow-up nuclear stress test in August 2015 showed fixed lateral defect with no ischemia.  EF 50%.  Plavix was discontinued in August 2013 after upper GI bleed.  Follow-up ETT obtained on 05/30/2017 showed no ST segment deviation during stress, overall considered normal ETT.    On follow up today he is doing well. He denies any chest pain or SOB. He is walking 3 miles per day. Not doing as much on the bike. He eats a healthy diet. No complaints.    Past Medical History:  Diagnosis Date   Anemia    Antral ulcer    Anxiety    Blood transfusion    9/13 with bleeding ulcer   Cancer ([place]) 11/12   skin cancer   Coronary atherosclerosis    Duodenitis    Hiatal hernia    History of acute lateral wall MI 2004   treated with a 3.0 x 18 mm Cypher stent/    Hyperlipidemia    Hypokinesia    with EF of 49%  /by  cardiolite study   Ischemic heart disease    Upper GI bleed     Past Surgical History:  Procedure Laterality Date   CARDIAC CATHETERIZATION  03/09/2003   EF  50%    cardiac stents     2004   ESOPHAGOGASTRODUODENOSCOPY  05/15/2012   Procedure: ESOPHAGOGASTRODUODENOSCOPY (EGD);  Surgeon: Lafayette Dragon, MD;  Location: [facility];  Service: Endoscopy;  Laterality: N/A;   SKIN GRAFT  2012   TONSILLECTOMY      Current Medications: Outpatient Medications Prior to Visit  Medication Sig Dispense Refill   aspirin 81 MG EC tablet Take 1 tablet (81 mg total) by mouth daily. Swallow whole.     atorvastatin  (LIPITOR) 80 MG tablet TAKE 1 TABLET BY MOUTH DAILY AT 6 PM. 90 tablet 3   ezetimibe (ZETIA) 10 MG tablet Take 1 tablet (10 mg total) by mouth daily. 90 tablet 3   Multiple Vitamins-Minerals (CENTRUM SILVER PO) Take 1 tablet by mouth daily.     No facility-administered medications prior to visit.     Allergies:   Patient has no known allergies.   Social History   Socioeconomic History   Marital status: Married    Spouse name: Not on file   Number of children: 1   Years of education: Not on file   Highest education level: Not on file  Occupational History   Occupation: Chief Financial Officer    Employer: [employer]  Tobacco Use   Smoking status: Former    Packs/day: 1.00    Years: 32.00    Total pack years: 32.00    Types: Cigarettes    Quit date: 10/09/2002    Years since quitting: 19.4   Smokeless tobacco: Never  Vaping Use   Vaping Use: Never used  Substance and Sexual Activity   Alcohol use: Yes    Comment: 2 drinks a day 2-3 times per week.  Drug use: No   Sexual activity: Not on file  Other Topics Concern   Not on file  Social History Narrative   Not on file   Social Determinants of Health   Financial Resource Strain: Not on file  Food Insecurity: Not on file  Transportation Needs: Not on file  Physical Activity: Not on file  Stress: Not on file  Social Connections: Not on file     Family History:  The patient's family history includes Coronary artery disease in his father; Heart attack in his father; Heart disease in his father.   ROS:   Please see the history of present illness.    ROS All other systems reviewed and are negative.   PHYSICAL EXAM:   VS:  There were no vitals taken for this visit.   GEN: Well nourished, well developed, in no acute distress  HEENT: normal  Neck: no JVD, carotid bruits, or masses Cardiac: RRR; no murmurs, rubs, or gallops,no edema  Respiratory:  clear to auscultation bilaterally, normal work of breathing GI: soft, nontender,  nondistended, + BS MS: no deformity or atrophy  Skin: warm and dry, no rash Neuro:  Alert and Oriented x 3, Strength and sensation are intact Psych: euthymic mood, full affect  Wt Readings from Last 3 Encounters:  12/27/20 180 lb (81.6 kg)  12/04/19 182 lb 8 oz (82.8 kg)  04/30/18 176 lb 12.8 oz (80.2 kg)      Studies/Labs Reviewed:   EKG:  EKG is ordered today.  The ekg ordered today demonstrates normal sinus rhythm, rate 68. LAFB. No change from prior. I have personally reviewed and interpreted this study.   Recent Labs: No results found for requested labs within last 365 days.   Lipid Panel    Component Value Date/Time   CHOL 166 12/29/2020 1035   TRIG 114 12/29/2020 1035   HDL 49 12/29/2020 1035   CHOLHDL 3.4 12/29/2020 1035   CHOLHDL 3.1 02/23/2016 0901   VLDL 19 02/23/2016 0901   LDLCALC 96 12/29/2020 1035    Additional studies/ records that were reviewed today include:   ETT 05/30/2017 Study Highlights     Blood pressure demonstrated a normal response to exercise. There was no ST segment deviation noted during stress.   Normal ETT No ischemia     ASSESSMENT:    No diagnosis found.   PLAN:  In order of problems listed above:  CAD: Continue aspirin and Lipitor.  He is asymptomatic.  Hyperlipidemia: last LDL 83. Goal < 70. On lipitor 80 mg. Will update labs. If not at goal will consider additional therapy.    Medication Adjustments/Labs and Tests Ordered: Current medicines are reviewed at length with the patient today.  Concerns regarding medicines are outlined above.  Medication changes, Labs and Tests ordered today are listed in the Patient Instructions below. There are no Patient Instructions on file for this visit.  Signed, Arneda Sappington [place], MD  04/02/2022 1:36 PM    [place]

## 2022-04-05 ENCOUNTER — Ambulatory Visit: Payer: Medicare HMO | Admitting: Cardiology

## 2022-04-10 ENCOUNTER — Ambulatory Visit: Payer: Medicare HMO | Admitting: Physician Assistant

## 2022-04-10 ENCOUNTER — Other Ambulatory Visit: Payer: Self-pay | Admitting: Physician Assistant

## 2022-04-10 ENCOUNTER — Encounter: Payer: Self-pay | Admitting: Physician Assistant

## 2022-04-10 VITALS — BP 128/86 | HR 59 | Ht 72.0 in | Wt 174.6 lb

## 2022-04-10 DIAGNOSIS — I2510 Atherosclerotic heart disease of native coronary artery without angina pectoris: Secondary | ICD-10-CM

## 2022-04-10 DIAGNOSIS — E785 Hyperlipidemia, unspecified: Secondary | ICD-10-CM | POA: Diagnosis not present

## 2022-04-10 MED ORDER — ROSUVASTATIN CALCIUM 40 MG PO TABS: 40.0000 mg | ORAL_TABLET | Freq: Every day | ORAL | 3 refills | Status: DC

## 2022-04-10 NOTE — Patient Instructions (Signed)
Medication Instructions:  STOP Lipitor START Rosuvastatin (Crestor) 40 mg daiy *If you need a refill on your cardiac medications before your next appointment, please call your pharmacy*  Lab Work: Your physician recommends that you return for lab work in 3 months:  CBC CMP Fasting Lipid Panel-DO NOT eat or drink past midnight. Okay to have water If you have labs (blood work) drawn today and your tests are completely normal, you will receive your results only by: [place] (if you have MyChart) OR A paper copy in the mail If you have any lab test that is abnormal or we need to change your treatment, we will call you to review the results.  Testing/Procedures: NONE ordered at this time of appointment   Follow-Up: At [facility], you and your health needs are our priority.  As part of our continuing mission to provide you with exceptional heart care, we have created designated Provider Care Teams.  These Care Teams include your primary Cardiologist (physician) and Advanced Practice Providers (APPs -  Physician Assistants and Nurse Practitioners) who all work together to provide you with the care you need, when you need it.  We recommend signing up for the patient portal called "MyChart".  Sign up information is provided on this After Visit Summary.  MyChart is used to connect with patients for Virtual Visits (Telemedicine).  Patients are able to view lab/test results, encounter notes, upcoming appointments, etc.  Non-urgent messages can be sent to your provider as well.   To learn more about what you can do with MyChart, go to [email or web address].    Your next appointment:   1 year(s)  The format for your next appointment:   In Person  Provider:   Peter [place], MD     Other Instructions   Important Information About Sugar

## 2022-04-10 NOTE — Progress Notes (Unsigned)
Cardiology Office Note:    Date:  04/12/2022   ID:  Ricardo Arnold, DOB February 01, 1951, MRN [id number]  PCP:  Kathyrn Lass   [place] Providers Cardiologist:  Peter [place], MD     Referring MD: No ref. provider found   Chief Complaint  Patient presents with   Follow-up    Seen for Dr. [place]    History of Present Illness:    Ricardo Arnold is a 71 y.o. male with a hx of CAD, hyperlipidemia and history of hiatal hernia.  Patient had lateral MI in March 2004 treated with DES to a left circumflex artery.  Repeat cardiac catheterization in May 2004 showed patent stent but mild to moderate diffuse coronary arthrosclerosis including 10 to 20% left main lesion, 20 to 40% mid LAD lesion 50 to 60% diagonal lesion, 50 to 60% OM lesion, 40 to 50% mid RCA lesion, 60% PDA lesion.  Myoview in 2015 showed fixed lateral defect but no ischemia.  EF 50%.  Plavix was discontinued in August 2013 after upper GI bleed.  ETT in August 2018 showed no ST changes, overall considered normal study.  Patient presents today for follow-up.  He denies any recent chest pain or shortness of breath.  He previously frequently go kayaking and he has very good functional ability.  EKG today shows low voltage but no major ST changes.  Given lack of symptom, I recommended continue on the medical therapy.  He is on aspirin and Lipitor.  He has never picked up the Zetia due to cost.  Previous blood work obtained in March 2022 showed elevated LDL, I recommended switching to Lipitor to Crestor.  He will need 48-monthfasting lipid panel, CBC and comprehensive metabolic panel.  He can follow-up in 1 year.   Past Medical History:  Diagnosis Date   Anemia    Antral ulcer    Anxiety    Blood transfusion    9/13 with bleeding ulcer   [place]   skin cancer   Coronary atherosclerosis    Duodenitis    Hiatal hernia    History of acute lateral wall MI 2004   treated with a 3.0 x 18 mm Cypher stent/    Hyperlipidemia     Hypokinesia    with EF of 49%  /by  cardiolite study   Ischemic heart disease    Upper GI bleed     Past Surgical History:  Procedure Laterality Date   CARDIAC CATHETERIZATION  03/09/2003   EF  50%    cardiac stents     2004   ESOPHAGOGASTRODUODENOSCOPY  05/15/2012   Procedure: ESOPHAGOGASTRODUODENOSCOPY (EGD);  Surgeon: DLafayette Dragon MD;  [facility];  Service: Endoscopy;  Laterality: N/A;   SKIN GRAFT  2012   TONSILLECTOMY      Current Medications: Current Meds  Medication Sig   aspirin 81 MG EC tablet Take 1 tablet (81 mg total) by mouth daily. Swallow whole.   Multiple Vitamins-Minerals (CENTRUM SILVER PO) Take 1 tablet by mouth daily.   rosuvastatin (CRESTOR) 40 MG tablet Take 1 tablet (40 mg total) by mouth daily.   [DISCONTINUED] atorvastatin (LIPITOR) 80 MG tablet TAKE 1 TABLET BY MOUTH DAILY AT 6 PM.     Allergies:   Patient has no known allergies.   Social History   Socioeconomic History   Marital status: Married    Spouse name: Not on file   Number of children: 1   Years of education: Not on file  Highest education level: Not on file  Occupational History   Occupation: Lobbyist: [employer]  Tobacco Use   Smoking status: Former    Packs/day: 1.00    Years: 32.00    Total pack years: 32.00    Types: Cigarettes    Quit date: 10/09/2002    Years since quitting: 19.5   Smokeless tobacco: Never  Vaping Use   Vaping Use: Never used  Substance and Sexual Activity   Alcohol use: Yes    Comment: 2 drinks a day 2-3 times per week.   Drug use: No   Sexual activity: Not on file  Other Topics Concern   Not on file  Social History Narrative   Not on file   Social Determinants of Health   Financial Resource Strain: Not on file  Food Insecurity: Not on file  Transportation Needs: Not on file  Physical Activity: Not on file  Stress: Not on file  Social Connections: Not on file     Family History: The patient's family history includes  Coronary artery disease in his father; Heart attack in his father; Heart disease in his father.  ROS:   Please see the history of present illness.     All other systems reviewed and are negative.  EKGs/Labs/Other Studies Reviewed:    The following studies were reviewed today:  ETT 05/30/2017 normal, no ischemia  EKG:  EKG is ordered today.  The ekg ordered today demonstrates normal sinus rhythm, no significant ST-T wave changes.  Low voltage  Recent Labs: No results found for requested labs within last 365 days.  Recent Lipid Panel    Component Value Date/Time   CHOL 166 12/29/2020 1035   TRIG 114 12/29/2020 1035   HDL 49 12/29/2020 1035   CHOLHDL 3.4 12/29/2020 1035   CHOLHDL 3.1 02/23/2016 0901   VLDL 19 02/23/2016 0901   LDLCALC 96 12/29/2020 1035     Risk Assessment/Calculations:           Physical Exam:    VS:  BP 128/86   Pulse (!) 59   Ht 6' (1.829 m)   Wt 174 lb 9.6 oz (79.2 kg)   SpO2 97%   BMI 23.68 kg/m     Wt Readings from Last 3 Encounters:  04/10/22 174 lb 9.6 oz (79.2 kg)  12/27/20 180 lb (81.6 kg)  12/04/19 182 lb 8 oz (82.8 kg)     GEN:  Well nourished, well developed in no acute distress HEENT: Normal NECK: No JVD; No carotid bruits LYMPHATICS: No lymphadenopathy CARDIAC: RRR, no murmurs, rubs, gallops RESPIRATORY:  Clear to auscultation without rales, wheezing or rhonchi  ABDOMEN: Soft, non-tender, non-distended MUSCULOSKELETAL:  No edema; No deformity  SKIN: Warm and dry NEUROLOGIC:  Alert and oriented x 3 PSYCHIATRIC:  Normal affect   ASSESSMENT:    1. Coronary artery disease involving native coronary artery of native heart without angina pectoris   2. Hyperlipidemia LDL goal <70    PLAN:    In order of problems listed above:  CAD: Last PCI was in 2004.  Has not had any major events since.  Myoview in 2015 was normal.  Exercise treadmill test in August 2018 was also normal.  He denies any recent exertional chest pain or  worsening dyspnea.  He is still fairly active.  I recommended continue aspirin 81 mg daily.  Obtain annual blood work  Hyperlipidemia: LDL goal less than 70, he never picked up the previous Zetia due to  cost.  I recommended switching 80 mg Lipitor to 40 mg daily of Crestor.  Plan to repeat fasting lipid panel and LFT in 3 months, if cholesterol is still uncontrolled, we will refer patient to lipid clinic.           Medication Adjustments/Labs and Tests Ordered: Current medicines are reviewed at length with the patient today.  Concerns regarding medicines are outlined above.  Orders Placed This Encounter  Procedures   Lipid panel   CBC   Comprehensive metabolic panel   EKG [id number]   Meds ordered this encounter  Medications   rosuvastatin (CRESTOR) 40 MG tablet    Sig: Take 1 tablet (40 mg total) by mouth daily.    Dispense:  90 tablet    Refill:  3    Patient Instructions  Medication Instructions:  STOP Lipitor START Rosuvastatin (Crestor) 40 mg daiy *If you need a refill on your cardiac medications before your next appointment, please call your pharmacy*  Lab Work: Your physician recommends that you return for lab work in 3 months:  CBC CMP Fasting Lipid Panel-DO NOT eat or drink past midnight. Okay to have water If you have labs (blood work) drawn today and your tests are completely normal, you will receive your results only by: [place] (if you have MyChart) OR A paper copy in the mail If you have any lab test that is abnormal or we need to change your treatment, we will call you to review the results.  Testing/Procedures: NONE ordered at this time of appointment   Follow-Up: At [facility], you and your health needs are our priority.  As part of our continuing mission to provide you with exceptional heart care, we have created designated Provider Care Teams.  These Care Teams include your primary Cardiologist (physician) and Advanced Practice Providers (APPs  -  Physician Assistants and Nurse Practitioners) who all work together to provide you with the care you need, when you need it.  We recommend signing up for the patient portal called "MyChart".  Sign up information is provided on this After Visit Summary.  MyChart is used to connect with patients for Virtual Visits (Telemedicine).  Patients are able to view lab/test results, encounter notes, upcoming appointments, etc.  Non-urgent messages can be sent to your provider as well.   To learn more about what you can do with MyChart, go to [email or web address].    Your next appointment:   1 year(s)  The format for your next appointment:   In Person  Provider:   Peter [place], MD     Other Instructions   Important Information About Sugar         Hilbert Corrigan, [place]  04/12/2022 11:41 PM    [place]

## 2022-04-12 ENCOUNTER — Encounter: Payer: Self-pay | Admitting: Physician Assistant

## 2022-07-28 ENCOUNTER — Encounter: Payer: Self-pay | Admitting: Gastroenterology

## 2022-09-21 DIAGNOSIS — I2510 Atherosclerotic heart disease of native coronary artery without angina pectoris: Secondary | ICD-10-CM | POA: Diagnosis not present

## 2022-09-21 DIAGNOSIS — E785 Hyperlipidemia, unspecified: Secondary | ICD-10-CM | POA: Diagnosis not present

## 2022-09-21 LAB — COMPREHENSIVE METABOLIC PANEL
ALT: 17 IU/L (ref 0–44)
AST: 23 IU/L (ref 0–40)
Albumin/Globulin Ratio: 2 (ref 1.2–2.2)
Albumin: 4.5 g/dL (ref 3.9–4.9)
Alkaline Phosphatase: 82 IU/L (ref 44–121)
BUN/Creatinine Ratio: 15 (ref 10–24)
BUN: 14 mg/dL (ref 8–27)
Bilirubin Total: 0.3 mg/dL (ref 0.0–1.2)
CO2: 26 mmol/L (ref 20–29)
Calcium: 9.5 mg/dL (ref 8.6–10.2)
Chloride: 103 mmol/L (ref 96–106)
Creatinine, Ser: 0.92 mg/dL (ref 0.76–1.27)
Globulin, Total: 2.2 g/dL (ref 1.5–4.5)
Glucose: 92 mg/dL (ref 70–99)
Potassium: 4.3 mmol/L (ref 3.5–5.2)
Sodium: 141 mmol/L (ref 134–144)
Total Protein: 6.7 g/dL (ref 6.0–8.5)
eGFR: 90 mL/min/{1.73_m2} (ref >59)

## 2022-09-21 LAB — LIPID PANEL
Chol/HDL Ratio: 3 ratio (ref 0.0–5.0)
Cholesterol, Total: 150 mg/dL (ref 100–199)
HDL: 50 mg/dL (ref >39)
LDL Chol Calc (NIH): 80 mg/dL (ref 0–99)
Triglycerides: 110 mg/dL (ref 0–149)
VLDL Cholesterol Cal: 20 mg/dL (ref 5–40)

## 2022-09-21 LAB — CBC
Hematocrit: 46.9 % (ref 37.5–51.0)
Hemoglobin: 16 g/dL (ref 13.0–17.7)
MCH: 32.4 pg (ref 26.6–33.0)
MCHC: 34.1 g/dL (ref 31.5–35.7)
MCV: 95 fL (ref 79–97)
Platelets: 268 10*3/uL (ref 150–450)
RBC: 4.94 x10E6/uL (ref 4.14–5.80)
RDW: 12 % (ref 11.6–15.4)
WBC: 8.2 10*3/uL (ref 3.4–10.8)

## 2022-09-26 ENCOUNTER — Other Ambulatory Visit: Payer: Self-pay

## 2022-09-27 ENCOUNTER — Telehealth: Payer: Self-pay | Admitting: Cardiology

## 2022-09-27 ENCOUNTER — Other Ambulatory Visit: Payer: Self-pay

## 2022-09-27 DIAGNOSIS — I2510 Atherosclerotic heart disease of native coronary artery without angina pectoris: Secondary | ICD-10-CM

## 2022-09-27 DIAGNOSIS — E785 Hyperlipidemia, unspecified: Secondary | ICD-10-CM

## 2022-09-27 MED ORDER — EZETIMIBE 10 MG PO TABS: 10.0000 mg | ORAL_TABLET | Freq: Every day | ORAL | 6 refills | Status: DC

## 2022-09-27 NOTE — Telephone Encounter (Signed)
Patient was returning call. Please advise ?

## 2022-12-25 ENCOUNTER — Other Ambulatory Visit: Payer: Self-pay

## 2022-12-25 DIAGNOSIS — E785 Hyperlipidemia, unspecified: Secondary | ICD-10-CM

## 2022-12-25 DIAGNOSIS — I2510 Atherosclerotic heart disease of native coronary artery without angina pectoris: Secondary | ICD-10-CM

## 2023-01-18 DIAGNOSIS — E785 Hyperlipidemia, unspecified: Secondary | ICD-10-CM | POA: Diagnosis not present

## 2023-01-18 DIAGNOSIS — I2510 Atherosclerotic heart disease of native coronary artery without angina pectoris: Secondary | ICD-10-CM | POA: Diagnosis not present

## 2023-01-19 LAB — HEPATIC FUNCTION PANEL
ALT: 23 IU/L (ref 0–44)
AST: 31 IU/L (ref 0–40)
Albumin: 4.4 g/dL (ref 3.9–4.9)
Alkaline Phosphatase: 80 IU/L (ref 44–121)
Bilirubin Total: 0.4 mg/dL (ref 0.0–1.2)
Bilirubin, Direct: 0.14 mg/dL (ref 0.00–0.40)
Total Protein: 6.9 g/dL (ref 6.0–8.5)

## 2023-01-19 LAB — LIPID PANEL
Chol/HDL Ratio: 2.6 ratio (ref 0.0–5.0)
Cholesterol, Total: 155 mg/dL (ref 100–199)
HDL: 59 mg/dL (ref >39)
LDL Chol Calc (NIH): 80 mg/dL (ref 0–99)
Triglycerides: 85 mg/dL (ref 0–149)
VLDL Cholesterol Cal: 16 mg/dL (ref 5–40)

## 2023-01-29 ENCOUNTER — Other Ambulatory Visit: Payer: Self-pay

## 2023-01-29 ENCOUNTER — Telehealth: Payer: Self-pay

## 2023-01-29 DIAGNOSIS — I2510 Atherosclerotic heart disease of native coronary artery without angina pectoris: Secondary | ICD-10-CM

## 2023-01-29 DIAGNOSIS — E785 Hyperlipidemia, unspecified: Secondary | ICD-10-CM

## 2023-01-29 NOTE — Telephone Encounter (Signed)
Patient called back to discuss labs results. Patient states he is returning my call from 01/22/23.   Patient given lab results. Patient states he is currently taking both, Zetia and Crestor.   Patient agreeable with Wynema Birch recommendations and voiced understanding.   Patient requested lab slip be mailed to him.

## 2023-01-30 NOTE — Telephone Encounter (Signed)
Printed and mailed lab orders to patient on 01/29/23

## 2023-03-30 ENCOUNTER — Other Ambulatory Visit: Payer: Self-pay | Admitting: Physician Assistant

## 2023-05-09 ENCOUNTER — Other Ambulatory Visit: Payer: Self-pay | Admitting: Physician Assistant

## 2023-05-18 DIAGNOSIS — I2510 Atherosclerotic heart disease of native coronary artery without angina pectoris: Secondary | ICD-10-CM | POA: Diagnosis not present

## 2023-05-18 DIAGNOSIS — E785 Hyperlipidemia, unspecified: Secondary | ICD-10-CM | POA: Diagnosis not present

## 2023-05-28 ENCOUNTER — Telehealth: Payer: Self-pay

## 2023-05-31 NOTE — Telephone Encounter (Signed)
APPT SCHEDULED 07/20/2023 Time:  2:00 PM

## 2023-06-08 ENCOUNTER — Other Ambulatory Visit: Payer: Self-pay | Admitting: Cardiology

## 2023-07-14 NOTE — Progress Notes (Signed)
Cardiology Office Note    Date:  07/20/2023   ID:  Ricardo Arnold, DOB 1951/06/21, MRN [id number]  PCP:  Pcp, No  Cardiologist:  Dr. Saleah Rishel [place]  Chief Complaint  Patient presents with   Follow-up    1 year.   Coronary Artery Disease    History of Present Illness:  Ricardo Arnold is a 72 y.o. male with PMH of CAD and HLD.  Patient had a history of lateral MI in March 2004 treated with Cypher 3.0 x 18 mm DES of the LCx.  Cardiac catheterization in 2004 showed patent stent but mild to moderate diffuse coronary artery atherosclerosis.  Follow-up nuclear stress test in August 2015 showed fixed lateral defect with no ischemia.  EF 50%.  Plavix was discontinued in August 2013 after upper GI bleed.  Follow-up ETT obtained on 05/30/2017 showed no ST segment deviation during stress, overall considered normal ETT.    On follow up today he is doing well. He denies any chest pain or SOB. He is walking regularly and kayaking, biking. He eats a healthy diet. No complaints. Would like to lose some weight. Notes BP at home usually 120/80   Past Medical History:  Diagnosis Date   Anemia    Antral ulcer    Anxiety    Blood transfusion    9/13 with bleeding ulcer   Cancer ([facility]) 11/12   skin cancer   Coronary atherosclerosis    Duodenitis    Hiatal hernia    History of acute lateral wall MI 2004   treated with a 3.0 x 18 mm Cypher stent/    Hyperlipidemia    Hypokinesia    with EF of 49%  /by  cardiolite study   Ischemic heart disease    Upper GI bleed     Past Surgical History:  Procedure Laterality Date   CARDIAC CATHETERIZATION  03/09/2003   EF  50%    cardiac stents     2004   ESOPHAGOGASTRODUODENOSCOPY  05/15/2012   Procedure: ESOPHAGOGASTRODUODENOSCOPY (EGD);  Surgeon: Hart Carwin, MD;  Location: [facility];  Service: Endoscopy;  Laterality: N/A;   SKIN GRAFT  2012   TONSILLECTOMY      Current Medications: Outpatient Medications Prior to Visit  Medication Sig Dispense  Refill   aspirin 81 MG EC tablet Take 1 tablet (81 mg total) by mouth daily. Swallow whole.     ezetimibe (ZETIA) 10 MG tablet TAKE 1 TABLET BY MOUTH EVERY DAY 30 tablet 6   Multiple Vitamins-Minerals (CENTRUM SILVER PO) Take 1 tablet by mouth daily.     rosuvastatin (CRESTOR) 40 MG tablet TAKE 1 TABLET BY MOUTH EVERY DAY 30 tablet 1   No facility-administered medications prior to visit.     Allergies:   Patient has no known allergies.   Social History   Socioeconomic History   Marital status: Married    Spouse name: Not on file   Number of children: 1   Years of education: Not on file   Highest education level: Not on file  Occupational History   Occupation: Art gallery manager    Employer: [employer]  Tobacco Use   Smoking status: Former    Current packs/day: 0.00    Average packs/day: 1 pack/day for 32.0 years (32.0 ttl pk-yrs)    Types: Cigarettes    Start date: 10/09/1970    Quit date: 10/09/2002    Years since quitting: 20.7   Smokeless tobacco: Never  Vaping Use   Vaping status: Never Used  Substance and Sexual Activity   Alcohol use: Yes    Comment: 2 drinks a day 2-3 times per week.   Drug use: No   Sexual activity: Not on file  Other Topics Concern   Not on file  Social History Narrative   Not on file   Social Determinants of Health   Financial Resource Strain: Not on file  Food Insecurity: Not on file  Transportation Needs: Not on file  Physical Activity: Not on file  Stress: Not on file  Social Connections: Not on file     Family History:  The patient's family history includes Coronary artery disease in his father; Heart attack in his father; Heart disease in his father.   ROS:   Please see the history of present illness.    ROS All other systems reviewed and are negative.   PHYSICAL EXAM:   VS:  BP (!) 146/80 (BP Location: Right Arm, Cuff Size: Normal)   Pulse 73   Ht 6' (1.829 m)   Wt 178 lb (80.7 kg)   BMI 24.14 kg/m    GEN: Well nourished, well  developed, in no acute distress  HEENT: normal  Neck: no JVD, carotid bruits, or masses Cardiac: RRR; no murmurs, rubs, or gallops,no edema  Respiratory:  clear to auscultation bilaterally, normal work of breathing GI: soft, nontender, nondistended, + BS MS: no deformity or atrophy  Skin: warm and dry, no rash Neuro:  Alert and Oriented x 3, Strength and sensation are intact Psych: euthymic mood, full affect  Wt Readings from Last 3 Encounters:  07/20/23 178 lb (80.7 kg)  04/10/22 174 lb 9.6 oz (79.2 kg)  12/27/20 180 lb (81.6 kg)      Studies/Labs Reviewed:   EKG Interpretation Date/Time:  Friday July 20 2023 13:49:33 EDT Ventricular Rate:  73 PR Interval:  152 QRS Duration:  104 QT Interval:  382 QTC Calculation: 420 R Axis:   -54  Text Interpretation: Normal sinus rhythm Left anterior fascicular block When compared with ECG of 13-May-2012 12:42, Left anterior fascicular block is now Present Confirmed by [place], Daryl Beehler [phone]) on 07/20/2023 1:59:57 PM  EKG Interpretation Date/Time:  Friday July 20 2023 13:49:33 EDT Ventricular Rate:  73 PR Interval:  152 QRS Duration:  104 QT Interval:  382 QTC Calculation: 420 R Axis:   -54  Text Interpretation: Normal sinus rhythm Left anterior fascicular block When compared with ECG of 13-May-2012 12:42, Left anterior fascicular block is now Present Confirmed by [place], Evangelia Whitaker [phone]) on 07/20/2023 1:59:57 PM     Recent Labs: 09/21/2022: BUN 14; Creatinine, Ser 0.92; Hemoglobin 16.0; Platelets 268; Potassium 4.3; Sodium 141 05/18/2023: ALT 24   Lipid Panel    Component Value Date/Time   CHOL 142 05/18/2023 0946   TRIG 96 05/18/2023 0946   HDL 55 05/18/2023 0946   CHOLHDL 2.6 05/18/2023 0946   CHOLHDL 3.1 02/23/2016 0901   VLDL 19 02/23/2016 0901   LDLCALC 69 05/18/2023 0946    Additional studies/ records that were reviewed today include:   ETT 05/30/2017 Study Highlights     Blood pressure demonstrated a normal  response to exercise. There was no ST segment deviation noted during stress.   Normal ETT No ischemia     ASSESSMENT:    1. Ischemic heart disease   2. CAD in native artery      PLAN:  In order of problems listed above:  CAD: Continue aspirin and statin.  He is asymptomatic.  Hyperlipidemia: last LDL  69. On high dose Crestor and Zetia. With addition of Zetia LDL dropped 11 pts.   Follow up in one year    Medication Adjustments/Labs and Tests Ordered: Current medicines are reviewed at length with the patient today.  Concerns regarding medicines are outlined above.  Medication changes, Labs and Tests ordered today are listed in the Patient Instructions below. There are no Patient Instructions on file for this visit.  Signed, Kani Jobson [place], MD  07/20/2023 2:01 PM    [facility]

## 2023-07-20 ENCOUNTER — Encounter: Payer: Self-pay | Admitting: Cardiology

## 2023-07-20 ENCOUNTER — Ambulatory Visit: Payer: Medicare HMO | Attending: Cardiology | Admitting: Cardiology

## 2023-07-20 VITALS — BP 146/80 | HR 73 | Ht 72.0 in | Wt 178.0 lb

## 2023-07-20 DIAGNOSIS — I259 Chronic ischemic heart disease, unspecified: Secondary | ICD-10-CM | POA: Diagnosis not present

## 2023-07-20 DIAGNOSIS — I2510 Atherosclerotic heart disease of native coronary artery without angina pectoris: Secondary | ICD-10-CM

## 2023-07-20 NOTE — Patient Instructions (Signed)
Medication Instructions:  Continue taking medication *If you need a refill on your cardiac medications before your next appointment, please call your pharmacy*   Lab Work: none   Testing/Procedures: none   Follow-Up: At [facility], you and your health needs are our priority.  As part of our continuing mission to provide you with exceptional heart care, we have created designated Provider Care Teams.  These Care Teams include your primary Cardiologist (physician) and Advanced Practice Providers (APPs -  Physician Assistants and Nurse Practitioners) who all work together to provide you with the care you need, when you need it.  We recommend signing up for the patient portal called "MyChart".  Sign up information is provided on this After Visit Summary.  MyChart is used to connect with patients for Virtual Visits (Telemedicine).  Patients are able to view lab/test results, encounter notes, upcoming appointments, etc.  Non-urgent messages can be sent to your provider as well.   To learn more about what you can do with MyChart, go to [email or web address].    Your next appointment:   Follow up 1 year  call in June for an October appointment   Provider:   [place]

## 2023-08-31 DIAGNOSIS — N3001 Acute cystitis with hematuria: Secondary | ICD-10-CM | POA: Diagnosis not present

## 2023-08-31 DIAGNOSIS — R350 Frequency of micturition: Secondary | ICD-10-CM | POA: Diagnosis not present

## 2023-09-07 DIAGNOSIS — R300 Dysuria: Secondary | ICD-10-CM | POA: Diagnosis not present

## 2023-09-07 DIAGNOSIS — R319 Hematuria, unspecified: Secondary | ICD-10-CM | POA: Diagnosis not present

## 2023-09-14 DIAGNOSIS — R310 Gross hematuria: Secondary | ICD-10-CM | POA: Diagnosis not present

## 2023-09-14 DIAGNOSIS — N401 Enlarged prostate with lower urinary tract symptoms: Secondary | ICD-10-CM | POA: Diagnosis not present

## 2023-09-14 DIAGNOSIS — R3915 Urgency of urination: Secondary | ICD-10-CM | POA: Diagnosis not present

## 2023-09-14 DIAGNOSIS — N35011 Post-traumatic bulbous urethral stricture: Secondary | ICD-10-CM | POA: Diagnosis not present

## 2023-09-14 DIAGNOSIS — R8289 Other abnormal findings on cytological and histological examination of urine: Secondary | ICD-10-CM | POA: Diagnosis not present

## 2023-10-17 DIAGNOSIS — K449 Diaphragmatic hernia without obstruction or gangrene: Secondary | ICD-10-CM | POA: Diagnosis not present

## 2023-10-17 DIAGNOSIS — R310 Gross hematuria: Secondary | ICD-10-CM | POA: Diagnosis not present

## 2023-10-31 DIAGNOSIS — R310 Gross hematuria: Secondary | ICD-10-CM | POA: Diagnosis not present

## 2023-10-31 DIAGNOSIS — R3915 Urgency of urination: Secondary | ICD-10-CM | POA: Diagnosis not present

## 2023-10-31 DIAGNOSIS — R9349 Abnormal radiologic findings on diagnostic imaging of other urinary organs: Secondary | ICD-10-CM | POA: Diagnosis not present

## 2023-10-31 DIAGNOSIS — N35011 Post-traumatic bulbous urethral stricture: Secondary | ICD-10-CM | POA: Diagnosis not present

## 2023-10-31 DIAGNOSIS — N401 Enlarged prostate with lower urinary tract symptoms: Secondary | ICD-10-CM | POA: Diagnosis not present

## 2023-11-07 ENCOUNTER — Telehealth: Payer: Self-pay | Admitting: *Deleted

## 2023-11-07 NOTE — Telephone Encounter (Signed)
   Pre-operative Risk Assessment    Patient Name: Ricardo Arnold  DOB: 1951-02-28 MRN: [id number]   Date of last office visit: 07/20/23 DR. Swaziland Date of next office visit: NONE   Request for Surgical Clearance    Procedure:  PROSTATE Bx   Date of Surgery:  Clearance TBD                                Surgeon:  DR. Jennette Bill Surgeon's Group or Practice Name:  [facility] Phone number:  [phone] Fax number:  [phone]   Type of Clearance Requested:   - Medical  - Pharmacy:  Hold Aspirin x 5 DAYS PRIOR   Type of Anesthesia:  Not Indicated   Additional requests/questions:    [name]   11/07/2023, 6:16 PM

## 2023-11-08 ENCOUNTER — Telehealth: Payer: Self-pay | Admitting: *Deleted

## 2023-11-08 NOTE — Telephone Encounter (Signed)
Left msg for pt to return call.

## 2023-11-08 NOTE — Telephone Encounter (Signed)
Patient returned call and I scheduled him for a telehealth preop clearance visit on 11/20/23. Consent in, meds reviewed.

## 2023-11-08 NOTE — Telephone Encounter (Signed)
  Patient Consent for Virtual Visit        Ricardo Arnold has provided verbal consent on 11/08/2023 for a virtual visit (video or telephone).   CONSENT FOR VIRTUAL VISIT FOR:  Ricardo Arnold  By participating in this virtual visit I agree to the following:  I hereby voluntarily request, consent and authorize [facility] and its employed or contracted physicians, physician assistants, nurse practitioners or other licensed health care professionals (the Practitioner), to provide me with telemedicine health care services (the "Services") as deemed necessary by the treating Practitioner. I acknowledge and consent to receive the Services by the Practitioner via telemedicine. I understand that the telemedicine visit will involve communicating with the Practitioner through live audiovisual communication technology and the disclosure of certain medical information by electronic transmission. I acknowledge that I have been given the opportunity to request an in-person assessment or other available alternative prior to the telemedicine visit and am voluntarily participating in the telemedicine visit.  I understand that I have the right to withhold or withdraw my consent to the use of telemedicine in the course of my care at any time, without affecting my right to future care or treatment, and that the Practitioner or I may terminate the telemedicine visit at any time. I understand that I have the right to inspect all information obtained and/or recorded in the course of the telemedicine visit and may receive copies of available information for a reasonable fee.  I understand that some of the potential risks of receiving the Services via telemedicine include:  Delay or interruption in medical evaluation due to technological equipment failure or disruption; Information transmitted may not be sufficient (e.g. poor resolution of images) to allow for appropriate medical decision making by the Practitioner;  and/or  In rare instances, security protocols could fail, causing a breach of personal health information.  Furthermore, I acknowledge that it is my responsibility to provide information about my medical history, conditions and care that is complete and accurate to the best of my ability. I acknowledge that Practitioner's advice, recommendations, and/or decision may be based on factors not within their control, such as incomplete or inaccurate data provided by me or distortions of diagnostic images or specimens that may result from electronic transmissions. I understand that the practice of medicine is not an exact science and that Practitioner makes no warranties or guarantees regarding treatment outcomes. I acknowledge that a copy of this consent can be made available to me via my patient portal [facility] MyChart), or I can request a printed copy by calling the office of [facility].    I understand that my insurance will be billed for this visit.   I have read or had this consent read to me. I understand the contents of this consent, which adequately explains the benefits and risks of the Services being provided via telemedicine.  I have been provided ample opportunity to ask questions regarding this consent and the Services and have had my questions answered to my satisfaction. I give my informed consent for the services to be provided through the use of telemedicine in my medical care

## 2023-11-08 NOTE — Telephone Encounter (Signed)
Will route back to requesting office to make them aware.

## 2023-11-08 NOTE — Telephone Encounter (Signed)
   Name: Ricardo Arnold  DOB: 21-Dec-1950  MRN: [id number]  Primary Cardiologist: Peter Swaziland, MD   Preoperative team, please contact this patient and set up a phone call appointment for further preoperative risk assessment. Please obtain consent and complete medication review. Thank you for your help.  I confirm that guidance regarding antiplatelet and oral anticoagulation therapy has been completed and, if necessary, noted below.  Patient's aspirin is not prescribed by cardiology.  Recommendations for holding aspirin will need to come from prescribing provider.  I also confirmed the patient resides in the state of [place]. As per [facility] Medical Board telemedicine laws, the patient must reside in the state in which the provider is licensed.   Ronney Asters, NP 11/08/2023, 10:23 AM [facility]

## 2023-11-20 ENCOUNTER — Ambulatory Visit: Payer: Medicare HMO | Attending: Nurse Practitioner | Admitting: Nurse Practitioner

## 2023-11-20 ENCOUNTER — Encounter: Payer: Self-pay | Admitting: Nurse Practitioner

## 2023-11-20 DIAGNOSIS — Z01810 Encounter for preprocedural cardiovascular examination: Secondary | ICD-10-CM

## 2023-11-20 NOTE — Progress Notes (Signed)
Virtual Visit via Telephone Note   Because of Ricardo Arnold co-morbid illnesses, he is at least at moderate risk for complications without adequate follow up.  This format is felt to be most appropriate for this patient at this time.  The patient did not have access to video technology/had technical difficulties with video requiring transitioning to audio format only (telephone).  All issues noted in this document were discussed and addressed.  No physical exam could be performed with this format.  Please refer to the patient's chart for his consent to telehealth for [facility].  Evaluation Performed:  Preoperative cardiovascular risk assessment _____________   Date:  11/20/2023   Patient ID:  Ricardo Arnold, DOB 1951-02-16, MRN [id number] Patient Location:  Home Provider location:   Office  Primary Care Provider:  Pcp, No Primary Cardiologist:  Ricardo Swaziland, MD  Chief Complaint / Patient Profile   73 y.o. y/o male with a h/o CAD s/p lateral MI in 2004 treated with DES to LCx (3.0 x 18 mm), GIB, HTN, hyperlipidemia who is pending prostate biopsy with Dr. Jennette Bill on date TBD and presents today for telephonic preoperative cardiovascular risk assessment.  History of Present Illness    Ricardo Arnold is a 73 y.o. male who presents via audio/video conferencing for a telehealth visit today.  Pt was last seen in cardiology clinic on 07/20/2023 by Dr. Swaziland.  At that time Ricardo Arnold was doing well.  The patient is now pending procedure as outlined above. He reports currently having hematuria which is reason for biopsy. Since his last visit, he denies chest pain, shortness of breath, lower extremity edema, fatigue, palpitations, melena, presyncope, syncope, orthopnea, and PND. He remains very active with cycling, kayaking and walking for exercise and is able to achieve > 4 METS activity without concerning cardiac symptoms.   Past Medical History    Past Medical History:  Diagnosis  Date   Anemia    Antral ulcer    Anxiety    Blood transfusion    9/13 with bleeding ulcer   Cancer (HCC) 11/12   skin cancer   Coronary atherosclerosis    Duodenitis    Hiatal hernia    History of acute lateral wall MI 2004   treated with a 3.0 x 18 mm Cypher stent/    Hyperlipidemia    Hypokinesia    with EF of 49%  /by  cardiolite study   Ischemic heart disease    Upper GI bleed    Past Surgical History:  Procedure Laterality Date   CARDIAC CATHETERIZATION  03/09/2003   EF  50%    cardiac stents     2004   ESOPHAGOGASTRODUODENOSCOPY  05/15/2012   Procedure: ESOPHAGOGASTRODUODENOSCOPY (EGD);  Surgeon: Hart Carwin, MD;  Location: [facility];  Service: Endoscopy;  Laterality: N/A;   SKIN GRAFT  2012   TONSILLECTOMY      Allergies  No Known Allergies  Home Medications    Prior to Admission medications   Medication Sig Start Date End Date Taking? Authorizing Provider  aspirin 81 MG EC tablet Take 1 tablet (81 mg total) by mouth daily. Swallow whole. 05/16/12   Standley Brooking, MD  ezetimibe (ZETIA) 10 MG tablet TAKE 1 TABLET BY MOUTH EVERY DAY 05/09/23   Azalee Course, PA  Multiple Vitamins-Minerals (CENTRUM SILVER PO) Take 1 tablet by mouth daily.    [provider]  rosuvastatin (CRESTOR) 40 MG tablet TAKE 1 TABLET BY MOUTH EVERY DAY 06/08/23  Arnold, Ricardo M, MD    Physical Exam    Vital Signs:  Ricardo Arnold does not have vital signs available for review today.  Given telephonic nature of communication, physical exam is limited. AAOx3. NAD. Normal affect.  Speech and respirations are unlabored.  Accessory Clinical Findings    None  Assessment & Plan    1.  Preoperative Cardiovascular Risk Assessment: According to the Revised Cardiac Risk Index (RCRI), his Perioperative Risk of Major Cardiac Event is (%): 6.6. His Functional Capacity in METs is: 8.97 according to the Duke Activity Status Index (DASI). The patient is doing well from a cardiac perspective.  Therefore, based on ACC/AHA guidelines, the patient would be at acceptable risk for the planned procedure without further cardiovascular testing.   The patient was advised that if he develops new symptoms prior to surgery to contact our office to arrange for a follow-up visit, and he verbalized understanding.  Per office protocol, he may hold aspirin for 5-7 days prior to procedure and should resume as soon as hemodynamically stable postoperatively.  A copy of this note will be routed to requesting surgeon.  Time:   Today, I have spent 10 minutes with the patient with telehealth technology discussing medical history, symptoms, and management plan.    Ricardo Aland, NP-C  11/20/2023, 10:19 AM [address], [address] Office [phone] Fax [phone]

## 2023-12-09 ENCOUNTER — Other Ambulatory Visit: Payer: Self-pay | Admitting: Physician Assistant

## 2023-12-14 DIAGNOSIS — R338 Other retention of urine: Secondary | ICD-10-CM | POA: Diagnosis not present

## 2024-01-09 DIAGNOSIS — N401 Enlarged prostate with lower urinary tract symptoms: Secondary | ICD-10-CM | POA: Diagnosis not present

## 2024-01-15 DIAGNOSIS — N401 Enlarged prostate with lower urinary tract symptoms: Secondary | ICD-10-CM | POA: Diagnosis not present

## 2024-01-25 ENCOUNTER — Other Ambulatory Visit: Payer: Self-pay

## 2024-01-25 ENCOUNTER — Emergency Department (HOSPITAL_BASED_OUTPATIENT_CLINIC_OR_DEPARTMENT_OTHER)
Admission: EM | Admit: 2024-01-25 | Discharge: 2024-01-25 | Disposition: A | Discharge status: Home / Self Care | Attending: Emergency Medicine | Admitting: Emergency Medicine

## 2024-01-25 ENCOUNTER — Encounter (HOSPITAL_BASED_OUTPATIENT_CLINIC_OR_DEPARTMENT_OTHER): Payer: Self-pay | Admitting: Emergency Medicine

## 2024-01-25 DIAGNOSIS — R339 Retention of urine, unspecified: Secondary | ICD-10-CM | POA: Diagnosis not present

## 2024-01-25 DIAGNOSIS — Z8546 Personal history of malignant neoplasm of prostate: Secondary | ICD-10-CM | POA: Diagnosis not present

## 2024-01-25 DIAGNOSIS — Z7982 Long term (current) use of aspirin: Secondary | ICD-10-CM | POA: Insufficient documentation

## 2024-01-25 LAB — URINALYSIS, ROUTINE W REFLEX MICROSCOPIC
Bacteria, UA: NONE SEEN
Bilirubin Urine: NEGATIVE
Glucose, UA: NEGATIVE mg/dL
Nitrite: NEGATIVE
Protein, ur: 30 mg/dL — AB
Specific Gravity, Urine: 1.012 (ref 1.005–1.030)
pH: 6.5 (ref 5.0–8.0)

## 2024-01-25 NOTE — ED Provider Notes (Signed)
 [place] EMERGENCY DEPARTMENT AT [facility] Provider Note   CSN: [id number] Arrival date & time: 01/25/24  1152     History  Chief Complaint  Patient presents with   Urinary Retention    Ricardo Arnold is a 73 y.o. male.  73 year old male with a history of prostate cancer on finasteride who presents emergency department with urinary retention.  Patient reports that he has been having dysuria for the past month.  Has also had some episodes of urinary retention.  Tamsulosin was increased by urology to twice daily.  Has not been able to urinate since last night and came into the emergency department for evaluation.  No fevers or chills.  Says that he has been treated for a UTI with the last dose of antibiotics on Tuesday.  Reports that oftentimes urinalysis will look like a urinary tract infection but the cultures will come back negative.  No history of prostate surgeries or procedures.       Home Medications Prior to Admission medications   Medication Sig Start Date End Date Taking? Authorizing Provider  aspirin  81 MG EC tablet Take 1 tablet (81 mg total) by mouth daily. Swallow whole. 05/16/12   Lonita Roach, MD  ezetimibe  (ZETIA ) 10 MG tablet TAKE 1 TABLET BY MOUTH EVERY DAY 12/11/23   [place], Peter M, MD  Multiple Vitamins-Minerals (CENTRUM SILVER PO) Take 1 tablet by mouth daily.    [provider]  rosuvastatin  (CRESTOR ) 40 MG tablet TAKE 1 TABLET BY MOUTH EVERY DAY 06/08/23   [place], Peter M, MD      Allergies    Patient has no known allergies.    Review of Systems   Review of Systems  Physical Exam Updated Vital Signs BP (!) 151/87 (BP Location: Left Arm)   Pulse 76   Temp 98.3 F (36.8 C) (Oral)   Resp 18   SpO2 99%  Physical Exam Constitutional:      Appearance: Normal appearance.  Abdominal:     General: There is distension.     Palpations: There is no mass.     Tenderness: There is no abdominal tenderness. There is no right CVA  tenderness, left CVA tenderness or guarding.  Neurological:     Mental Status: He is alert.     ED Results / Procedures / Treatments   Labs (all labs ordered are listed, but only abnormal results are displayed) Labs Reviewed  URINALYSIS, ROUTINE W REFLEX MICROSCOPIC - Abnormal; Notable for the following components:      Result Value   Hgb urine dipstick SMALL (*)    Ketones, ur TRACE (*)    Protein, ur 30 (*)    Leukocytes,Ua TRACE (*)    All other components within normal limits  URINE CULTURE    EKG None  Radiology No results found.  Procedures BLADDER CATHETERIZATION  Date/Time: 01/25/2024 12:59 PM  Performed by: Ninetta Basket, MD Authorized by: Ninetta Basket, MD   Consent:    Consent obtained:  Verbal   Consent given by:  Patient Pre-procedure details:    Procedure purpose:  Therapeutic Anesthesia:    Anesthesia method:  None Procedure details:    Catheter insertion:  Temporary indwelling   Catheter type:  Coude   Catheter size:  18 Fr   Number of attempts:  1   Urine characteristics:  Cloudy Post-procedure details:    Procedure completion:  Tolerated well, no immediate complications Comments:     Chaperoned by tech alex  Medications Ordered in ED Medications - No data to display  ED Course/ Medical Decision Making/ A&P Clinical Course as of 01/25/24 1339  Fri Jan 25, 2024  1212 Bladder scan with 484 mL of urine [RP]    Clinical Course User Index [RP] Ninetta Basket, MD                                 Medical Decision Making Amount and/or Complexity of Data Reviewed Labs: ordered.   Ricardo Arnold is a 74 y.o. male with comorbidities that complicate the patient evaluation including  prostate cancer on finasteride who presents emergency department with urinary retention.    Initial Ddx:  Urinary retention, AKI, UTI, prostate cancer  MDM/Course:  Patient presents emergency department for urinary retention.  Does have a  history of prostate cancer.  Is already on tamsulosin twice daily.  Has been treated recently for UTI but says that his urine cultures have come back as negative.  No flank pain.  On exam does have some distention in his lower abdomen.  Had a Foley catheter that was placed with 700 mL of urine out.  No blood.  Urinalysis not clearly consistent with UTI.  Will send culture and call him if there is an infection to avoid excessive antibiotic use.  Will have him follow-up with his urologist in several days to see if he can have the Foley catheter removed as well.  This patient presents to the ED for concern of complaints listed in HPI, this involves an extensive number of treatment options, and is a complaint that carries with it a high risk of complications and morbidity. Disposition including potential need for admission considered.   Dispo: DC Home. Return precautions discussed including, but not limited to, those listed in the AVS. Allowed pt time to ask questions which were answered fully prior to dc.  Records reviewed Outpatient Clinic Notes The following labs were independently interpreted: Urinalysis and show  not cw uti I have reviewed the patients home medications and made adjustments as needed Social Determinants of health:  Geriatric  Portions of this note were generated with Scientist, clinical (histocompatibility and immunogenetics). Dictation errors may occur despite best attempts at proofreading.     Final Clinical Impression(s) / ED Diagnoses Final diagnoses:  Urinary retention  History of prostate cancer    Rx / DC Orders ED Discharge Orders     None         Ninetta Basket, MD 01/25/24 1339

## 2024-01-25 NOTE — Discharge Instructions (Signed)
 You were seen for your urinary retention in the emergency department.  He had a Foley catheter that was placed.  Check your MyChart online for the results of any tests that had not resulted by the time you left the emergency department.  You will be called if you need antibiotics based on your urine culture.  Follow-up with your urologist in 2-3 days regarding your visit.    Return immediately to the emergency department if you experience any of the following: Difficulty urinating, fevers, severe pain, or any other concerning symptoms.    Thank you for visiting our Emergency Department. It was a pleasure taking care of you today.

## 2024-01-25 NOTE — ED Triage Notes (Signed)
 Hx of prostate cancer. Started having discomfort since yesterday morning. States last time urinating was yesterday morning. Had catheter placed approx last moved but removed after 1 week.

## 2024-01-27 ENCOUNTER — Other Ambulatory Visit: Payer: Self-pay

## 2024-01-27 ENCOUNTER — Emergency Department (HOSPITAL_BASED_OUTPATIENT_CLINIC_OR_DEPARTMENT_OTHER)
Admission: EM | Admit: 2024-01-27 | Discharge: 2024-01-27 | Disposition: A | Discharge status: Home / Self Care | Attending: Emergency Medicine | Admitting: Emergency Medicine

## 2024-01-27 ENCOUNTER — Encounter (HOSPITAL_BASED_OUTPATIENT_CLINIC_OR_DEPARTMENT_OTHER): Payer: Self-pay

## 2024-01-27 DIAGNOSIS — Y846 Urinary catheterization as the cause of abnormal reaction of the patient, or of later complication, without mention of misadventure at the time of the procedure: Secondary | ICD-10-CM | POA: Insufficient documentation

## 2024-01-27 DIAGNOSIS — Z7982 Long term (current) use of aspirin: Secondary | ICD-10-CM | POA: Insufficient documentation

## 2024-01-27 DIAGNOSIS — R310 Gross hematuria: Secondary | ICD-10-CM | POA: Diagnosis not present

## 2024-01-27 DIAGNOSIS — R339 Retention of urine, unspecified: Secondary | ICD-10-CM | POA: Diagnosis not present

## 2024-01-27 DIAGNOSIS — T83091A Other mechanical complication of indwelling urethral catheter, initial encounter: Secondary | ICD-10-CM

## 2024-01-27 LAB — URINE CULTURE: Culture: 30000 — AB

## 2024-01-27 NOTE — ED Provider Notes (Signed)
 [place] EMERGENCY DEPARTMENT AT [facility]  Provider Note  CSN: [id number] Arrival date & time: 01/27/24 0133  History Chief Complaint  Patient presents with   Catheter Clogged    Ricardo Arnold is a 73 y.o. male here yesterday for urinary retention had a foley placed then, reports he noticed some blood in his urine and had to switch from leg back to regular bag due to a clot yesterday but seemed to be draining fine until around 1700hrs. He has noticed increasingly pink then red urine up until that time but has had no output since then. He feels some occasional bladder spasms.    Home Medications Prior to Admission medications   Medication Sig Start Date End Date Taking? Authorizing Provider  aspirin  81 MG EC tablet Take 1 tablet (81 mg total) by mouth daily. Swallow whole. 05/16/12   Lonita Roach, MD  ezetimibe  (ZETIA ) 10 MG tablet TAKE 1 TABLET BY MOUTH EVERY DAY 12/11/23   [place], Peter M, MD  Multiple Vitamins-Minerals (CENTRUM SILVER PO) Take 1 tablet by mouth daily.    [provider]  rosuvastatin  (CRESTOR ) 40 MG tablet TAKE 1 TABLET BY MOUTH EVERY DAY 06/08/23   [place], Peter M, MD     Allergies    Patient has no known allergies.   Review of Systems   Review of Systems Please see HPI for pertinent positives and negatives  Physical Exam BP 136/86   Pulse 91   Temp 98.8 F (37.1 C) (Oral)   Resp 18   SpO2 100%   Physical Exam Vitals and nursing note reviewed.  HENT:     Head: Normocephalic.     Nose: Nose normal.  Eyes:     Extraocular Movements: Extraocular movements intact.  Pulmonary:     Effort: Pulmonary effort is normal.  Musculoskeletal:        General: Normal range of motion.     Cervical back: Neck supple.  Skin:    Findings: No rash (on exposed skin).  Neurological:     Mental Status: He is alert and oriented to person, place, and time.  Psychiatric:        Mood and Affect: Mood normal.     ED Results / Procedures /  Treatments   EKG None  Procedures Procedures  Medications Ordered in the ED Medications - No data to display  Initial Impression and Plan  Patient with foley placed yesterday for retention now with hematuria and clots in his bag. Suspect he has a clog in his catheter. Will ask RN to flush the catheter and irrigate his bladder. Culture done on urine yesterday has prelim result of 30k colonies of Staph warneri. No sensitivies yet.   ED Course   Clinical Course as of 01/27/24 0346  Sun Jan 27, 2024  0344 Patient's foley flushed and now draining freely with grossly bloody urine. He has a urologist he can see tomorrow. He is comfortable flushing the catheter himself and will be given supplies in case he gets clogged again. RTED for any other concerns.  [CS]    Clinical Course User Index [CS] Charmayne Cooper, MD     MDM Rules/Calculators/A&P Medical Decision Making Problems Addressed: Gross hematuria: acute illness or injury Obstructed Foley catheter, initial encounter [facility]): acute illness or injury  Amount and/or Complexity of Data Reviewed External Data Reviewed: labs.     Final Clinical Impression(s) / ED Diagnoses Final diagnoses:  Obstructed Foley catheter, initial encounter (HCC)  Gross hematuria  Rx / DC Orders ED Discharge Orders     None        Charmayne Cooper, MD 01/27/24 [phone]

## 2024-01-27 NOTE — ED Triage Notes (Signed)
 Pt states he had foley placed yesterday, began bleeding when he went home, reports no drainage since around 5pm yesterday.

## 2024-01-28 ENCOUNTER — Telehealth (HOSPITAL_BASED_OUTPATIENT_CLINIC_OR_DEPARTMENT_OTHER): Payer: Self-pay

## 2024-01-28 NOTE — Progress Notes (Signed)
 ED Antimicrobial Stewardship Positive Culture Follow Up   Ricardo Arnold is an 73 y.o. male who presented to [facility] on 01/25/2024 with a chief complaint of  Chief Complaint  Patient presents with   Urinary Retention    Recent Results (from the past 720 hours)  Urine Culture     Status: Abnormal   Collection Time: 01/25/24 12:43 PM   Specimen: Urine, Catheterized  Result Value Ref Range Status   Specimen Description   Final    URINE, CATHETERIZED Performed at [facility], [address] [place], [place] [address]    Special Requests   Final    NONE Performed at [facility], [address] [place], [place] [address]    Culture 30,000 COLONIES/mL STAPHYLOCOCCUS WARNERI (A)  Final   Report Status 01/27/2024 FINAL  Final   Organism ID, Bacteria STAPHYLOCOCCUS WARNERI (A)  Final      Susceptibility   Staphylococcus warneri - MIC*    CIPROFLOXACIN <=0.5 SENSITIVE Sensitive     GENTAMICIN <=0.5 SENSITIVE Sensitive     NITROFURANTOIN <=16 SENSITIVE Sensitive     OXACILLIN <=0.25 SENSITIVE Sensitive     TETRACYCLINE <=1 SENSITIVE Sensitive     VANCOMYCIN <=0.5 SENSITIVE Sensitive     TRIMETH/SULFA <=10 SENSITIVE Sensitive     RIFAMPIN <=0.5 SENSITIVE Sensitive     Inducible Clindamycin NEGATIVE Sensitive     * 30,000 COLONIES/mL STAPHYLOCOCCUS WARNERI   73 YOM presented with urinary retention, urinary catheter placed this admission. Patient afebrile, HR 76, RR 18. Patient returned to hospital on 4/20 with blood in urine, provider acknowledge urine culture results at that time but noted sensitivities were not yet released. Patient discharged without antimicrobial agent, no further treatment indicated at this time per MD.   ED Provider: Dr. Jayson Ninetta Morna Serena, PharmD PGY-1 Acute Care Pharmacy Resident 01/28/2024 10:23 AM Monday - Friday phone -  [phone] Saturday - Sunday phone - [phone]

## 2024-01-28 NOTE — Telephone Encounter (Signed)
 Post ED Visit - Positive Culture Follow-up  Culture report reviewed by antimicrobial stewardship pharmacist: [facility] [x]  Volney Grumbles, Pharm.D. []  Skeet Duke, Pharm.D., BCPS AQ-ID []  Leslee Rase, Pharm.D., BCPS []  Garland Junk, [address].D., BCPS []  [place], [place].D., BCPS, AAHIVP []  Alcide Aly, Pharm.D., BCPS, AAHIVP []  Jerri Morale, PharmD, BCPS []  Graham Laws, PharmD, BCPS []  Cleda Curly, PharmD, BCPS []  Tamar Fairly, PharmD []  Ballard Levels, PharmD, BCPS []  Ollen Beverage, PharmD  [facility] []  Arlyne Bering, PharmD []  Sherryle Don, PharmD []  Van Gelinas, PharmD []  Delila Felty, Rph []  [place]) Cleora Daft, PharmD []  Augustina Block, PharmD []  Arie Kurtz, PharmD []  Sharlyn Deaner, PharmD []  Agnes Hose, PharmD []  Kendall Pauls, PharmD []  Gladstone Lamer, PharmD []  Armanda Bern, PharmD []  Tera Fellows, PharmD   Positive urine culture Reviewed by Russella Courts, MD  Recent UTI, completed abx Levofloxacin 01/22/24. 01/25/24 Urinary Retention - foley placed Afebrile, HR 76, RR 18  No treatment needed and no further patient follow-up is required at this time.  Delena Feil 01/28/2024, 10:24 AM

## 2024-02-05 ENCOUNTER — Other Ambulatory Visit (HOSPITAL_COMMUNITY): Payer: Self-pay | Admitting: Urology

## 2024-02-05 DIAGNOSIS — C61 Malignant neoplasm of prostate: Secondary | ICD-10-CM

## 2024-02-06 DIAGNOSIS — C61 Malignant neoplasm of prostate: Secondary | ICD-10-CM | POA: Diagnosis not present

## 2024-02-06 DIAGNOSIS — R338 Other retention of urine: Secondary | ICD-10-CM | POA: Diagnosis not present

## 2024-02-13 ENCOUNTER — Encounter (HOSPITAL_COMMUNITY)
Admission: RE | Admit: 2024-02-13 | Discharge: 2024-02-13 | Disposition: A | Discharge status: Home / Self Care | Source: Ambulatory Visit | Attending: Urology | Admitting: Urology

## 2024-02-13 DIAGNOSIS — C61 Malignant neoplasm of prostate: Secondary | ICD-10-CM | POA: Insufficient documentation

## 2024-02-13 MED ORDER — FLOTUFOLASTAT F 18 GALLIUM 296-5846 MBQ/ML IV SOLN
7.5300 | Freq: Once | INTRAVENOUS | Status: AC
  Administered 2024-02-13: 7.53 via INTRAVENOUS

## 2024-02-20 DIAGNOSIS — C61 Malignant neoplasm of prostate: Secondary | ICD-10-CM | POA: Diagnosis not present

## 2024-02-20 DIAGNOSIS — R3914 Feeling of incomplete bladder emptying: Secondary | ICD-10-CM | POA: Diagnosis not present

## 2024-02-20 DIAGNOSIS — R300 Dysuria: Secondary | ICD-10-CM | POA: Diagnosis not present

## 2024-02-20 DIAGNOSIS — N401 Enlarged prostate with lower urinary tract symptoms: Secondary | ICD-10-CM | POA: Diagnosis not present

## 2024-02-29 DIAGNOSIS — C61 Malignant neoplasm of prostate: Secondary | ICD-10-CM | POA: Diagnosis not present

## 2024-04-02 DIAGNOSIS — N401 Enlarged prostate with lower urinary tract symptoms: Secondary | ICD-10-CM | POA: Diagnosis not present

## 2024-04-02 DIAGNOSIS — R3914 Feeling of incomplete bladder emptying: Secondary | ICD-10-CM | POA: Diagnosis not present

## 2024-04-02 DIAGNOSIS — C61 Malignant neoplasm of prostate: Secondary | ICD-10-CM | POA: Diagnosis not present

## 2024-04-08 ENCOUNTER — Telehealth: Payer: Self-pay | Admitting: Radiation Oncology

## 2024-04-08 NOTE — Telephone Encounter (Signed)
Left message for patient to call back to schedule consult per 6/26 referral.

## 2024-04-28 NOTE — Progress Notes (Signed)
 GU Location of Tumor / Histology: Prostate Ca  If Prostate Cancer, Gleason Score is (4 + 5) and PSA is (4.53 on 03/27/2024)  Ricardo Arnold presented as referral from Dr. Steffan Pea [facility]) elevated PSA.  Biopsies     02/13/2024 Dr. Steffan Pea NM PET (PSMA) Skull to Mid Thigh CLINICAL DATA: Prostate carcinoma with biochemical recurrence.   IMPRESSION: 1. Markedly enlarged irregular prostate gland with intense peripheral radiotracer activity consistent with primary prostate adenocarcinoma. Central photopenia suggests necrosis. 2. Enlarged LEFT external iliac lymph node with intense peripheral radiotracer activity consistent with nodal metastasis. 3. No evidence of visceral metastasis or skeletal metastasis. 4. Mild LEFT hydroureter suggesting partial obstruction of the LEFT ureter at the level of the bladder. 5. Small RIGHT upper lobe pulmonary nodule does not have radiotracer activity. Favor benign pulmonary nodule.    Past/Anticipated interventions by urology, if any:   04/03/2024 Dr. Steffan Pea   Past/Anticipated interventions by medical oncology, if any:  NA  Weight changes, if any: No  IPSS:  16 SHIM:  5  Bowel/Bladder complaints, if any: No  Nausea/Vomiting, if any: No  Pain issues, if any:  0/10  SAFETY ISSUES: Prior radiation?  No Pacemaker/ICD? No Possible current pregnancy? Male Is the patient on methotrexate? No  Current Complaints / other details:    30 minutes spent total, including time for meaningful use questions, reviewing medication, as well as spent in face-to-face time in nurse evaluation with the patient.

## 2024-05-06 ENCOUNTER — Encounter: Payer: Self-pay | Admitting: Cardiology

## 2024-05-06 NOTE — Telephone Encounter (Signed)
 Called patient left message on personal voice mail to call back to discuss.We have listed in your chart Crestor  40 mg daily.

## 2024-05-07 ENCOUNTER — Other Ambulatory Visit: Payer: Self-pay

## 2024-05-07 DIAGNOSIS — E785 Hyperlipidemia, unspecified: Secondary | ICD-10-CM

## 2024-05-07 DIAGNOSIS — I2510 Atherosclerotic heart disease of native coronary artery without angina pectoris: Secondary | ICD-10-CM

## 2024-05-07 NOTE — Telephone Encounter (Signed)
 Received call back from patient.He stated he is not sure if he is taking Atorvastatin  or Rosuvastatin .He does remember it is 80 mg.Stated when he goes home he will verify correct name and mg.He will send message in [place].Advised he needs to have a fasting lipid panel and cmet done.Stated he will have done tomorrow.He stated he has prostate cancer and has appointment with oncologist on Friday 8/1.Follow up appointment scheduled with Dr.Jordan 07/16/24 at 4:20 pm at [facility] office.I will make Dr.Jordan aware.

## 2024-05-08 DIAGNOSIS — E785 Hyperlipidemia, unspecified: Secondary | ICD-10-CM | POA: Diagnosis not present

## 2024-05-08 DIAGNOSIS — I2510 Atherosclerotic heart disease of native coronary artery without angina pectoris: Secondary | ICD-10-CM | POA: Diagnosis not present

## 2024-05-09 ENCOUNTER — Encounter: Payer: Self-pay | Admitting: Radiation Oncology

## 2024-05-09 ENCOUNTER — Ambulatory Visit: Payer: Self-pay | Admitting: Cardiology

## 2024-05-09 ENCOUNTER — Ambulatory Visit
Admission: RE | Admit: 2024-05-09 | Discharge: 2024-05-09 | Disposition: A | Discharge status: Home / Self Care | Source: Ambulatory Visit | Attending: Radiation Oncology | Admitting: Radiation Oncology

## 2024-05-09 VITALS — BP 135/78 | HR 62 | Temp 98.2°F | Resp 16 | Ht 72.0 in | Wt 175.8 lb

## 2024-05-09 DIAGNOSIS — C61 Malignant neoplasm of prostate: Secondary | ICD-10-CM | POA: Diagnosis not present

## 2024-05-09 DIAGNOSIS — F17210 Nicotine dependence, cigarettes, uncomplicated: Secondary | ICD-10-CM | POA: Insufficient documentation

## 2024-05-09 DIAGNOSIS — I259 Chronic ischemic heart disease, unspecified: Secondary | ICD-10-CM | POA: Diagnosis not present

## 2024-05-09 DIAGNOSIS — C775 Secondary and unspecified malignant neoplasm of intrapelvic lymph nodes: Secondary | ICD-10-CM | POA: Insufficient documentation

## 2024-05-09 DIAGNOSIS — D649 Anemia, unspecified: Secondary | ICD-10-CM | POA: Diagnosis not present

## 2024-05-09 DIAGNOSIS — Z79899 Other long term (current) drug therapy: Secondary | ICD-10-CM | POA: Insufficient documentation

## 2024-05-09 DIAGNOSIS — Z87891 Personal history of nicotine dependence: Secondary | ICD-10-CM | POA: Diagnosis not present

## 2024-05-09 DIAGNOSIS — Z85828 Personal history of other malignant neoplasm of skin: Secondary | ICD-10-CM | POA: Insufficient documentation

## 2024-05-09 DIAGNOSIS — K449 Diaphragmatic hernia without obstruction or gangrene: Secondary | ICD-10-CM | POA: Insufficient documentation

## 2024-05-09 DIAGNOSIS — I2510 Atherosclerotic heart disease of native coronary artery without angina pectoris: Secondary | ICD-10-CM | POA: Insufficient documentation

## 2024-05-09 DIAGNOSIS — E785 Hyperlipidemia, unspecified: Secondary | ICD-10-CM | POA: Insufficient documentation

## 2024-05-09 DIAGNOSIS — I252 Old myocardial infarction: Secondary | ICD-10-CM | POA: Insufficient documentation

## 2024-05-09 DIAGNOSIS — Z8711 Personal history of peptic ulcer disease: Secondary | ICD-10-CM | POA: Insufficient documentation

## 2024-05-09 DIAGNOSIS — Z7982 Long term (current) use of aspirin: Secondary | ICD-10-CM | POA: Insufficient documentation

## 2024-05-09 DIAGNOSIS — Z191 Hormone sensitive malignancy status: Secondary | ICD-10-CM | POA: Diagnosis not present

## 2024-05-09 HISTORY — DX: Elevated prostate specific antigen (PSA): R97.20

## 2024-05-09 LAB — COMPREHENSIVE METABOLIC PANEL WITH GFR
ALT: 22 IU/L (ref 0–44)
AST: 35 IU/L (ref 0–40)
Albumin: 4.3 g/dL (ref 3.8–4.8)
Alkaline Phosphatase: 88 IU/L (ref 44–121)
BUN/Creatinine Ratio: 12 (ref 10–24)
BUN: 12 mg/dL (ref 8–27)
Bilirubin Total: 0.2 mg/dL (ref 0.0–1.2)
CO2: 23 mmol/L (ref 20–29)
Calcium: 9.5 mg/dL (ref 8.6–10.2)
Chloride: 104 mmol/L (ref 96–106)
Creatinine, Ser: 0.97 mg/dL (ref 0.76–1.27)
Globulin, Total: 2.5 g/dL (ref 1.5–4.5)
Glucose: 97 mg/dL (ref 70–99)
Potassium: 4.4 mmol/L (ref 3.5–5.2)
Sodium: 142 mmol/L (ref 134–144)
Total Protein: 6.8 g/dL (ref 6.0–8.5)
eGFR: 83 mL/min/1.73 (ref >59)

## 2024-05-09 LAB — LIPID PANEL
Chol/HDL Ratio: 4 ratio (ref 0.0–5.0)
Cholesterol, Total: 207 mg/dL — ABNORMAL HIGH (ref 100–199)
HDL: 52 mg/dL (ref >39)
LDL Chol Calc (NIH): 125 mg/dL — ABNORMAL HIGH (ref 0–99)
Triglycerides: 171 mg/dL — ABNORMAL HIGH (ref 0–149)
VLDL Cholesterol Cal: 30 mg/dL (ref 5–40)

## 2024-05-09 NOTE — Progress Notes (Signed)
 Radiation Oncology         [phone] ________________________________  Initial Outpatient Consultation  Name: Ricardo Arnold MRN: [id number]  Date: 05/09/2024  DOB: [birth year]  CC:Pcp, No  Shane Steffan BROCKS, MD   REFERRING PHYSICIAN: Shane Steffan BROCKS, MD  DIAGNOSIS: 73 y.o. 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    ICD-10-CM   1. Malignant neoplasm of prostate (HCC)  C61       HISTORY OF PRESENT ILLNESS: Ricardo Arnold is a 73 y.o. male with a diagnosis of prostate cancer. He was initially referred to Dr. Shane on 09/14/2019 for management of gross hematuria and LUTS.  He is a current smoker.  In office cystoscopy at that time showed irregular median lobe and clinically insignificant urethral strictures but no bladder tumors.  Given his significant LUTS, he was started on Flomax and finasteride.  A CT A/P was performed on 10/17/2023 and showed a large infiltrating mass completely replacing the prostate with extension into the bladder base and bilateral seminal vesicles with multiple pelvic lymph nodes concerning for metastatic disease.  He went into urinary retention prior to his initial scheduled biopsy but this was ultimately performed on 01/29/2024.  Digital rectal examination performed at that time showed bilateral firmness but no discrete nodularity.  The patient proceeded to transrectal ultrasound with 12 biopsies of the prostate on 01/29/2024.  The prostate volume measured 166 cc.  Out of 12 core biopsies, 12 were positive.  The maximum Gleason score was 5+4, and this was seen in the right apex lateral.  All other cores had Gleason 4+5 disease. A PSMA PET scan was performed on 02/13/2024 for disease staging and this confirmed a solitary tracer avid left external iliac lymph node.  He was started on ADT with Firmagon injections on 02/18/2024.  Erleada was added on  03/26/2024 and a PSA at that visit showed an excellent response to ADT, decreased to 4.53.  He received a 49-month Eligard injection on 04/02/2024 and appears to be tolerating the hormone deprivation well.  The patient reviewed the biopsy and imaging results with his urologist and he has kindly been referred today for discussion of potential radiation treatment options.   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   treated with a 3.0 x 18 mm Cypher stent/    Hyperlipidemia    Hypokinesia    with EF of 49%  /by  cardiolite  study   Ischemic heart disease    Upper GI bleed       PAST SURGICAL HISTORY: Past Surgical History:  Procedure Laterality Date   CARDIAC CATHETERIZATION  03/09/2003   EF  50%    cardiac stents     2004   ESOPHAGOGASTRODUODENOSCOPY  05/15/2012   Procedure: ESOPHAGOGASTRODUODENOSCOPY (EGD);  Surgeon: Princella CHRISTELLA Nida, MD;  Location: [facility];  Service: Endoscopy;  Laterality: N/A;   PROSTATE BIOPSY     SKIN GRAFT  10/09/2010   TONSILLECTOMY      FAMILY HISTORY:  Family History  Problem Relation Age of Onset   Coronary artery disease Father        had bypass in his 6's   Heart disease Father    Heart attack Father     SOCIAL HISTORY:  Social History   Socioeconomic History   Marital status: Married    Spouse name: Not on file   Number of children: 1   Years of education: Not on file   Highest education level: Not on file  Occupational History   Occupation: Art gallery manager    Employer: [employer]  Tobacco Use   Smoking status: Former    Current packs/day: 0.00    Average packs/day: 1 pack/day for 32.0 years (32.0 ttl pk-yrs)    Types: Cigarettes     Start date: 10/09/1970    Quit date: 10/09/2002    Years since quitting: 21.5   Smokeless tobacco: Never  Vaping Use   Vaping status: Never Used  Substance and Sexual Activity   Alcohol use: Yes    Comment: 2 drinks a day 2-3 times per week.   Drug use: No   Sexual activity: Not on file  Other Topics Concern   Not on file  Social History Narrative   Not on file   Social Drivers of Health   Financial Resource Strain: Not on file  Food Insecurity: No Food Insecurity (05/09/2024)   Hunger Vital Sign    Worried About Running Out of Food in the Last Year: Never true    Ran Out of Food in the Last Year: Never true  Transportation Needs: No Transportation Needs (05/09/2024)   PRAPARE - Administrator, Civil Service (Medical): No    Lack of Transportation (Non-Medical): No  Physical Activity: Not on file  Stress: Not on file  Social Connections: Not on file  Intimate Partner Violence: Not At Risk (05/09/2024)   Humiliation, Afraid, Rape, and Kick questionnaire    Fear of Current or Ex-Partner: No    Emotionally Abused: No    Physically Abused: No    Sexually Abused: No    ALLERGIES: Patient has no known allergies.  MEDICATIONS:  Current Outpatient Medications  Medication Sig Dispense Refill   aspirin  81 MG EC tablet Take 1 tablet (81 mg total) by mouth daily. Swallow whole.     ERLEADA 60 MG tablet Take 240 mg by mouth daily.     ezetimibe  (ZETIA ) 10 MG tablet TAKE 1 TABLET BY MOUTH EVERY DAY 90 tablet 2   finasteride (PROSCAR) 5 MG tablet Take 5 mg by mouth daily.     rosuvastatin  (CRESTOR ) 40 MG tablet TAKE 1 TABLET BY MOUTH EVERY DAY 30 tablet 1   tamsulosin (FLOMAX) 0.4 MG CAPS capsule Take 0.4 mg by mouth 2 (two) times daily.     Multiple Vitamins-Minerals (CENTRUM SILVER PO) Take 1 tablet by mouth daily. (Patient not taking: Reported on 05/09/2024)     No current facility-administered medications for this encounter.    REVIEW OF SYSTEMS:  On review of systems, the  patient reports that he is doing well overall. He denies any chest pain, shortness of breath, cough, fevers, chills, night sweats, unintended weight changes. He denies any bowel disturbances, and denies abdominal pain, nausea or vomiting. He denies any new musculoskeletal or joint aches or pains. His IPSS was 16, indicating moderate-severe urinary symptoms. His SHIM was 5, indicating he has severe erectile dysfunction. A complete review of systems is obtained and is otherwise negative.    PHYSICAL EXAM:  Wt Readings from Last 3 Encounters:  05/09/24 175 lb 12.8 oz (79.7 kg)  07/20/23 178 lb (80.7 kg)  04/10/22 174 lb 9.6 oz (79.2 kg)   Temp Readings from Last 3 Encounters:  05/09/24 98.2 F (36.8 C) (Oral)  01/27/24 98.5 F (36.9 C)  01/25/24 98.3 F (36.8 C) (Oral)   BP Readings from Last 3 Encounters:  05/09/24 135/78  01/27/24 135/85  01/25/24 (!) 151/87   Pulse Readings from Last 3 Encounters:  05/09/24 62  01/27/24 90  01/25/24 76   Pain Assessment Pain Score: 0-No pain/10  In general this is a well appearing Caucasian male in no acute distress. He's alert and oriented x4 and appropriate throughout the examination. Cardiopulmonary assessment is negative for acute distress, and he exhibits normal effort.     KPS = 100  100 - Normal; no complaints; no evidence of disease. 90   - Able to carry on normal activity; minor signs or symptoms of disease. 80   - Normal activity with effort; some signs or symptoms of disease. 75   - Cares for self; unable to carry on normal activity or to do active work. 60   - Requires occasional assistance, but is able to care for most of his personal needs. 50   - Requires considerable assistance and frequent medical care. 40   - Disabled; requires special care and assistance. 30   - Severely disabled; hospital admission is indicated although death not imminent. 20   - Very sick; hospital admission necessary; active supportive treatment  necessary. 10   - Moribund; fatal processes progressing rapidly. 0     - Dead  Karnofsky DA, Abelmann WH, Craver LS and Burchenal [facility] [phone]) The use of the nitrogen mustards in the palliative treatment of carcinoma: with particular reference to bronchogenic carcinoma Cancer 1 634-56  LABORATORY DATA:  Lab Results  Component Value Date   WBC 8.2 09/21/2022   HGB 16.0 09/21/2022   HCT 46.9 09/21/2022   MCV 95 09/21/2022   PLT 268 09/21/2022   Lab Results  Component Value Date   NA 142 05/08/2024   K 4.4 05/08/2024   CL 104 05/08/2024   CO2 23 05/08/2024   Lab Results  Component Value Date   ALT 22 05/08/2024   AST 35 05/08/2024   ALKPHOS 88 05/08/2024   BILITOT 0.2 05/08/2024     RADIOGRAPHY: No results found.    IMPRESSION/PLAN: 1. 73 y.o. 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 We discussed the patient's workup and outlined the nature of prostate cancer in this setting. The patient's T stage, Gleason's score, and PSA put him into the very high risk group and PET imaging confirms locally advanced disease involving a solitary left external iliac lymph node. Accordingly, he is eligible for definitive treatment with ADT concurrent with 8 weeks of external radiation which will include a boost to the PET positive lymph node. He has already started ADT on 02/18/2024.  We discussed and outlined the risks, benefits, short and long-term effects associated with radiotherapy and compared and contrasted these with prostatectomy. We do not recommend SpaceOAR gel placement in his case given the large volume of disease and close proximity of disease to the rectum. He appears to have a good understanding of his disease and our treatment recommendations which are of curative intent.  He was encouraged to ask questions that were  answered to his stated satisfaction.  At the conclusion  of our conversation, the patient is interested in moving forward with the recommended 8-week course of daily external beam radiotherapy concurrent with the complete androgen blockade with Eligard and Erleada that he has already started.  Since he is not a good candidate for SpaceOAR gel placement, we will proceed with treatment without the need for fiducial marker placement to spare him any further delay in starting the radiotherapy.  He is comfortable and in agreement with the stated plan and is tentatively scheduled for CT simulation at 3 PM on 05/13/2024.  He has freely signed written consent to proceed today in the office and a copy of this document will be placed in his medical record.  We will share our discussion with Dr. Shane and proceed with treatment planning accordingly, in anticipation of beginning the daily radiotherapy in the near future.  We enjoyed meeting him and his wife today and look forward to continuing to participate in his care.   We personally spent 70 minutes in this encounter including chart review, reviewing radiological studies, meeting face-to-face with the patient, entering orders and completing documentation.    Sabra MICAEL Rusk, PA-C    Donnice Barge, MD  [facility]  Radiation Oncology Direct Dial: [phone]  Fax: [phone] [email or web address]  Skype  LinkedIn

## 2024-05-12 NOTE — Progress Notes (Signed)
 Introduced myself to the patient as the prostate nurse navigator.  No barriers to care identified at this time.  He is here to discuss his radiation treatment options.  I gave him my business card and asked him to call me with questions or concerns.  Verbalized understanding.  ?

## 2024-05-13 ENCOUNTER — Ambulatory Visit
Admission: RE | Admit: 2024-05-13 | Discharge: 2024-05-13 | Disposition: A | Discharge status: Home / Self Care | Source: Ambulatory Visit | Attending: Radiation Oncology | Admitting: Radiation Oncology

## 2024-05-13 DIAGNOSIS — C775 Secondary and unspecified malignant neoplasm of intrapelvic lymph nodes: Secondary | ICD-10-CM | POA: Insufficient documentation

## 2024-05-13 DIAGNOSIS — Z510 Encounter for antineoplastic radiation therapy: Secondary | ICD-10-CM | POA: Diagnosis not present

## 2024-05-13 DIAGNOSIS — Z191 Hormone sensitive malignancy status: Secondary | ICD-10-CM | POA: Diagnosis not present

## 2024-05-13 DIAGNOSIS — C61 Malignant neoplasm of prostate: Secondary | ICD-10-CM | POA: Insufficient documentation

## 2024-05-13 NOTE — Progress Notes (Signed)
  Radiation Oncology         [phone] ________________________________  Name: Andersen Iorio MRN: [id number]  Date: 05/13/2024  DOB: [birth year]  SIMULATION AND TREATMENT PLANNING NOTE    ICD-10-CM   1. Malignant neoplasm of prostate (HCC)  C61       DIAGNOSIS:   73 y.o. 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   NARRATIVE:  The patient was brought to the CT Simulation planning suite.  Identity was confirmed.  All relevant records and images related to the planned course of therapy were reviewed.  The patient freely provided informed written consent to proceed with treatment after reviewing the details related to the planned course of therapy. The consent form was witnessed and verified by the simulation staff.  Then, the patient was set-up in a stable reproducible supine position for radiation therapy.  A vacuum lock pillow device was custom fabricated to position his legs in a reproducible immobilized position.  Then, I performed a urethrogram under sterile conditions to identify the prostatic apex.  CT images were obtained.  Surface markings were placed.  The CT images were loaded into the planning software.  Then the prostate target and avoidance structures including the rectum, bladder, bowel and hips were contoured.  Treatment planning then occurred.  The radiation prescription was entered and confirmed.  A total of one complex treatment devices was fabricated. I have requested : Intensity Modulated Radiotherapy (IMRT) is medically necessary for this case for the following reason:  Rectal sparing.SABRA  PLAN:   The prostate, seminal vesicles, and pelvic lymph nodes will initially be treated to 45 Gy in 25 fractions of 1.8 Gy followed by a boost to the prostate and PET positive lymph node, to 75 Gy with 15 additional fractions of 2.0 Gy   ________________________________  Donnice FELIX Patrcia, M.D.

## 2024-05-19 ENCOUNTER — Other Ambulatory Visit: Payer: Self-pay

## 2024-05-19 DIAGNOSIS — E785 Hyperlipidemia, unspecified: Secondary | ICD-10-CM

## 2024-05-22 DIAGNOSIS — C61 Malignant neoplasm of prostate: Secondary | ICD-10-CM | POA: Diagnosis not present

## 2024-05-22 DIAGNOSIS — C775 Secondary and unspecified malignant neoplasm of intrapelvic lymph nodes: Secondary | ICD-10-CM | POA: Diagnosis not present

## 2024-05-22 DIAGNOSIS — Z191 Hormone sensitive malignancy status: Secondary | ICD-10-CM | POA: Diagnosis not present

## 2024-05-22 DIAGNOSIS — Z510 Encounter for antineoplastic radiation therapy: Secondary | ICD-10-CM | POA: Diagnosis not present

## 2024-05-27 ENCOUNTER — Ambulatory Visit
Admission: RE | Admit: 2024-05-27 | Discharge: 2024-05-27 | Disposition: A | Discharge status: Home / Self Care | Source: Ambulatory Visit | Attending: Radiation Oncology

## 2024-05-27 ENCOUNTER — Other Ambulatory Visit: Payer: Self-pay | Admitting: Cardiology

## 2024-05-27 ENCOUNTER — Other Ambulatory Visit: Payer: Self-pay

## 2024-05-27 DIAGNOSIS — C775 Secondary and unspecified malignant neoplasm of intrapelvic lymph nodes: Secondary | ICD-10-CM | POA: Diagnosis not present

## 2024-05-27 DIAGNOSIS — C61 Malignant neoplasm of prostate: Secondary | ICD-10-CM | POA: Diagnosis not present

## 2024-05-27 DIAGNOSIS — Z510 Encounter for antineoplastic radiation therapy: Secondary | ICD-10-CM | POA: Diagnosis not present

## 2024-05-27 LAB — RAD ONC ARIA SESSION SUMMARY
Course Elapsed Days: 0
Plan Fractions Treated to Date: 1
Plan Prescribed Dose Per Fraction: 1.8 Gy
Plan Total Fractions Prescribed: 25
Plan Total Prescribed Dose: 45 Gy
Reference Point Dosage Given to Date: 1.8 Gy
Reference Point Session Dosage Given: 1.8 Gy
Session Number: 1

## 2024-05-28 ENCOUNTER — Ambulatory Visit
Admission: RE | Admit: 2024-05-28 | Discharge: 2024-05-28 | Disposition: A | Discharge status: Home / Self Care | Source: Ambulatory Visit | Attending: Radiation Oncology

## 2024-05-28 ENCOUNTER — Other Ambulatory Visit: Payer: Self-pay

## 2024-05-28 DIAGNOSIS — C61 Malignant neoplasm of prostate: Secondary | ICD-10-CM | POA: Diagnosis not present

## 2024-05-28 DIAGNOSIS — Z510 Encounter for antineoplastic radiation therapy: Secondary | ICD-10-CM | POA: Diagnosis not present

## 2024-05-28 DIAGNOSIS — C775 Secondary and unspecified malignant neoplasm of intrapelvic lymph nodes: Secondary | ICD-10-CM | POA: Diagnosis not present

## 2024-05-28 LAB — RAD ONC ARIA SESSION SUMMARY
Course Elapsed Days: 1
Plan Fractions Treated to Date: 2
Plan Prescribed Dose Per Fraction: 1.8 Gy
Plan Total Fractions Prescribed: 25
Plan Total Prescribed Dose: 45 Gy
Reference Point Dosage Given to Date: 3.6 Gy
Reference Point Session Dosage Given: 1.8 Gy
Session Number: 2

## 2024-05-29 ENCOUNTER — Other Ambulatory Visit: Payer: Self-pay

## 2024-05-29 ENCOUNTER — Ambulatory Visit

## 2024-05-29 ENCOUNTER — Ambulatory Visit
Admission: RE | Admit: 2024-05-29 | Discharge: 2024-05-29 | Disposition: A | Discharge status: Home / Self Care | Source: Ambulatory Visit | Attending: Radiation Oncology | Admitting: Radiation Oncology

## 2024-05-29 DIAGNOSIS — C61 Malignant neoplasm of prostate: Secondary | ICD-10-CM | POA: Diagnosis not present

## 2024-05-29 DIAGNOSIS — C775 Secondary and unspecified malignant neoplasm of intrapelvic lymph nodes: Secondary | ICD-10-CM | POA: Diagnosis not present

## 2024-05-29 DIAGNOSIS — Z510 Encounter for antineoplastic radiation therapy: Secondary | ICD-10-CM | POA: Diagnosis not present

## 2024-05-29 LAB — RAD ONC ARIA SESSION SUMMARY
Course Elapsed Days: 2
Plan Fractions Treated to Date: 3
Plan Prescribed Dose Per Fraction: 1.8 Gy
Plan Total Fractions Prescribed: 25
Plan Total Prescribed Dose: 45 Gy
Reference Point Dosage Given to Date: 5.4 Gy
Reference Point Session Dosage Given: 1.8 Gy
Session Number: 3

## 2024-05-30 ENCOUNTER — Other Ambulatory Visit: Payer: Self-pay

## 2024-05-30 ENCOUNTER — Ambulatory Visit
Admission: RE | Admit: 2024-05-30 | Discharge: 2024-05-30 | Disposition: A | Discharge status: Home / Self Care | Source: Ambulatory Visit | Attending: Radiation Oncology | Admitting: Radiation Oncology

## 2024-05-30 DIAGNOSIS — Z510 Encounter for antineoplastic radiation therapy: Secondary | ICD-10-CM | POA: Diagnosis not present

## 2024-05-30 DIAGNOSIS — C61 Malignant neoplasm of prostate: Secondary | ICD-10-CM | POA: Diagnosis not present

## 2024-05-30 DIAGNOSIS — C775 Secondary and unspecified malignant neoplasm of intrapelvic lymph nodes: Secondary | ICD-10-CM | POA: Diagnosis not present

## 2024-05-30 LAB — RAD ONC ARIA SESSION SUMMARY
Course Elapsed Days: 3
Plan Fractions Treated to Date: 4
Plan Prescribed Dose Per Fraction: 1.8 Gy
Plan Total Fractions Prescribed: 25
Plan Total Prescribed Dose: 45 Gy
Reference Point Dosage Given to Date: 7.2 Gy
Reference Point Session Dosage Given: 1.8 Gy
Session Number: 4

## 2024-06-02 ENCOUNTER — Ambulatory Visit
Admission: RE | Admit: 2024-06-02 | Discharge: 2024-06-02 | Disposition: A | Discharge status: Home / Self Care | Source: Ambulatory Visit | Attending: Radiation Oncology

## 2024-06-02 ENCOUNTER — Other Ambulatory Visit: Payer: Self-pay

## 2024-06-02 DIAGNOSIS — C61 Malignant neoplasm of prostate: Secondary | ICD-10-CM | POA: Diagnosis not present

## 2024-06-02 DIAGNOSIS — Z191 Hormone sensitive malignancy status: Secondary | ICD-10-CM | POA: Diagnosis not present

## 2024-06-02 DIAGNOSIS — Z510 Encounter for antineoplastic radiation therapy: Secondary | ICD-10-CM | POA: Diagnosis not present

## 2024-06-02 DIAGNOSIS — C775 Secondary and unspecified malignant neoplasm of intrapelvic lymph nodes: Secondary | ICD-10-CM | POA: Diagnosis not present

## 2024-06-02 LAB — RAD ONC ARIA SESSION SUMMARY
Course Elapsed Days: 6
Plan Fractions Treated to Date: 5
Plan Prescribed Dose Per Fraction: 1.8 Gy
Plan Total Fractions Prescribed: 25
Plan Total Prescribed Dose: 45 Gy
Reference Point Dosage Given to Date: 9 Gy
Reference Point Session Dosage Given: 1.8 Gy
Session Number: 5

## 2024-06-03 ENCOUNTER — Other Ambulatory Visit: Payer: Self-pay

## 2024-06-03 ENCOUNTER — Ambulatory Visit
Admission: RE | Admit: 2024-06-03 | Discharge: 2024-06-03 | Disposition: A | Discharge status: Home / Self Care | Source: Ambulatory Visit | Attending: Radiation Oncology

## 2024-06-03 DIAGNOSIS — C775 Secondary and unspecified malignant neoplasm of intrapelvic lymph nodes: Secondary | ICD-10-CM | POA: Diagnosis not present

## 2024-06-03 DIAGNOSIS — C61 Malignant neoplasm of prostate: Secondary | ICD-10-CM | POA: Diagnosis not present

## 2024-06-03 DIAGNOSIS — Z510 Encounter for antineoplastic radiation therapy: Secondary | ICD-10-CM | POA: Diagnosis not present

## 2024-06-03 LAB — RAD ONC ARIA SESSION SUMMARY
Course Elapsed Days: 7
Plan Fractions Treated to Date: 6
Plan Prescribed Dose Per Fraction: 1.8 Gy
Plan Total Fractions Prescribed: 25
Plan Total Prescribed Dose: 45 Gy
Reference Point Dosage Given to Date: 10.8 Gy
Reference Point Session Dosage Given: 1.8 Gy
Session Number: 6

## 2024-06-03 MED ORDER — ROSUVASTATIN CALCIUM 40 MG PO TABS: 40.0000 mg | ORAL_TABLET | Freq: Every day | ORAL | 0 refills | Status: DC

## 2024-06-03 NOTE — Telephone Encounter (Signed)
 Sent refill- 90 pills with no refills. Enough to get patient to his appt in October.  Did this in a different encounter- Refills 05/27/24 encounter

## 2024-06-03 NOTE — Telephone Encounter (Signed)
 Sent refill- 90 pills with no refills. Enough to get patient to his appt in October

## 2024-06-03 NOTE — Addendum Note (Signed)
 Addended by: Fadel Clason L on: 06/03/2024 08:01 AM   Modules accepted: Orders

## 2024-06-04 ENCOUNTER — Ambulatory Visit
Admission: RE | Admit: 2024-06-04 | Discharge: 2024-06-04 | Disposition: A | Discharge status: Home / Self Care | Source: Ambulatory Visit | Attending: Radiation Oncology | Admitting: Radiation Oncology

## 2024-06-04 ENCOUNTER — Other Ambulatory Visit: Payer: Self-pay

## 2024-06-04 DIAGNOSIS — Z510 Encounter for antineoplastic radiation therapy: Secondary | ICD-10-CM | POA: Diagnosis not present

## 2024-06-04 DIAGNOSIS — C61 Malignant neoplasm of prostate: Secondary | ICD-10-CM | POA: Diagnosis not present

## 2024-06-04 DIAGNOSIS — C775 Secondary and unspecified malignant neoplasm of intrapelvic lymph nodes: Secondary | ICD-10-CM | POA: Diagnosis not present

## 2024-06-04 LAB — RAD ONC ARIA SESSION SUMMARY
Course Elapsed Days: 8
Plan Fractions Treated to Date: 7
Plan Prescribed Dose Per Fraction: 1.8 Gy
Plan Total Fractions Prescribed: 25
Plan Total Prescribed Dose: 45 Gy
Reference Point Dosage Given to Date: 12.6 Gy
Reference Point Session Dosage Given: 1.8 Gy
Session Number: 7

## 2024-06-05 ENCOUNTER — Other Ambulatory Visit: Payer: Self-pay

## 2024-06-05 ENCOUNTER — Ambulatory Visit
Admission: RE | Admit: 2024-06-05 | Discharge: 2024-06-05 | Disposition: A | Discharge status: Home / Self Care | Source: Ambulatory Visit | Attending: Radiation Oncology | Admitting: Radiation Oncology

## 2024-06-05 DIAGNOSIS — C775 Secondary and unspecified malignant neoplasm of intrapelvic lymph nodes: Secondary | ICD-10-CM | POA: Diagnosis not present

## 2024-06-05 DIAGNOSIS — C61 Malignant neoplasm of prostate: Secondary | ICD-10-CM | POA: Diagnosis not present

## 2024-06-05 DIAGNOSIS — Z510 Encounter for antineoplastic radiation therapy: Secondary | ICD-10-CM | POA: Diagnosis not present

## 2024-06-05 LAB — RAD ONC ARIA SESSION SUMMARY
Course Elapsed Days: 9
Plan Fractions Treated to Date: 8
Plan Prescribed Dose Per Fraction: 1.8 Gy
Plan Total Fractions Prescribed: 25
Plan Total Prescribed Dose: 45 Gy
Reference Point Dosage Given to Date: 14.4 Gy
Reference Point Session Dosage Given: 1.8 Gy
Session Number: 8

## 2024-06-06 ENCOUNTER — Ambulatory Visit
Admission: RE | Admit: 2024-06-06 | Discharge: 2024-06-06 | Disposition: A | Discharge status: Home / Self Care | Source: Ambulatory Visit | Attending: Radiation Oncology | Admitting: Radiation Oncology

## 2024-06-06 ENCOUNTER — Other Ambulatory Visit: Payer: Self-pay

## 2024-06-06 DIAGNOSIS — Z510 Encounter for antineoplastic radiation therapy: Secondary | ICD-10-CM | POA: Diagnosis not present

## 2024-06-06 DIAGNOSIS — C61 Malignant neoplasm of prostate: Secondary | ICD-10-CM | POA: Diagnosis not present

## 2024-06-06 DIAGNOSIS — C775 Secondary and unspecified malignant neoplasm of intrapelvic lymph nodes: Secondary | ICD-10-CM | POA: Diagnosis not present

## 2024-06-06 LAB — RAD ONC ARIA SESSION SUMMARY
Course Elapsed Days: 10
Plan Fractions Treated to Date: 9
Plan Prescribed Dose Per Fraction: 1.8 Gy
Plan Total Fractions Prescribed: 25
Plan Total Prescribed Dose: 45 Gy
Reference Point Dosage Given to Date: 16.2 Gy
Reference Point Session Dosage Given: 1.8 Gy
Session Number: 9

## 2024-06-10 ENCOUNTER — Other Ambulatory Visit: Payer: Self-pay

## 2024-06-10 ENCOUNTER — Ambulatory Visit
Admission: RE | Admit: 2024-06-10 | Discharge: 2024-06-10 | Disposition: A | Discharge status: Home / Self Care | Source: Ambulatory Visit | Attending: Radiation Oncology | Admitting: Radiation Oncology

## 2024-06-10 DIAGNOSIS — Z510 Encounter for antineoplastic radiation therapy: Secondary | ICD-10-CM | POA: Insufficient documentation

## 2024-06-10 DIAGNOSIS — C775 Secondary and unspecified malignant neoplasm of intrapelvic lymph nodes: Secondary | ICD-10-CM | POA: Insufficient documentation

## 2024-06-10 DIAGNOSIS — C61 Malignant neoplasm of prostate: Secondary | ICD-10-CM | POA: Diagnosis not present

## 2024-06-10 DIAGNOSIS — Z191 Hormone sensitive malignancy status: Secondary | ICD-10-CM | POA: Diagnosis not present

## 2024-06-10 LAB — RAD ONC ARIA SESSION SUMMARY
Course Elapsed Days: 14
Plan Fractions Treated to Date: 10
Plan Prescribed Dose Per Fraction: 1.8 Gy
Plan Total Fractions Prescribed: 25
Plan Total Prescribed Dose: 45 Gy
Reference Point Dosage Given to Date: 18 Gy
Reference Point Session Dosage Given: 1.8 Gy
Session Number: 10

## 2024-06-11 ENCOUNTER — Other Ambulatory Visit: Payer: Self-pay

## 2024-06-11 ENCOUNTER — Ambulatory Visit
Admission: RE | Admit: 2024-06-11 | Discharge: 2024-06-11 | Disposition: A | Discharge status: Home / Self Care | Source: Ambulatory Visit | Attending: Radiation Oncology | Admitting: Radiation Oncology

## 2024-06-11 DIAGNOSIS — Z191 Hormone sensitive malignancy status: Secondary | ICD-10-CM | POA: Diagnosis not present

## 2024-06-11 DIAGNOSIS — Z510 Encounter for antineoplastic radiation therapy: Secondary | ICD-10-CM | POA: Diagnosis not present

## 2024-06-11 DIAGNOSIS — C61 Malignant neoplasm of prostate: Secondary | ICD-10-CM | POA: Diagnosis not present

## 2024-06-11 LAB — RAD ONC ARIA SESSION SUMMARY
Course Elapsed Days: 15
Plan Fractions Treated to Date: 11
Plan Prescribed Dose Per Fraction: 1.8 Gy
Plan Total Fractions Prescribed: 25
Plan Total Prescribed Dose: 45 Gy
Reference Point Dosage Given to Date: 19.8 Gy
Reference Point Session Dosage Given: 1.8 Gy
Session Number: 11

## 2024-06-12 ENCOUNTER — Ambulatory Visit
Admission: RE | Admit: 2024-06-12 | Discharge: 2024-06-12 | Disposition: A | Discharge status: Home / Self Care | Source: Ambulatory Visit | Attending: Radiation Oncology | Admitting: Radiation Oncology

## 2024-06-12 ENCOUNTER — Other Ambulatory Visit: Payer: Self-pay

## 2024-06-12 DIAGNOSIS — Z510 Encounter for antineoplastic radiation therapy: Secondary | ICD-10-CM | POA: Diagnosis not present

## 2024-06-12 LAB — RAD ONC ARIA SESSION SUMMARY
Course Elapsed Days: 16
Plan Fractions Treated to Date: 12
Plan Prescribed Dose Per Fraction: 1.8 Gy
Plan Total Fractions Prescribed: 25
Plan Total Prescribed Dose: 45 Gy
Reference Point Dosage Given to Date: 21.6 Gy
Reference Point Session Dosage Given: 1.8 Gy
Session Number: 12

## 2024-06-13 ENCOUNTER — Ambulatory Visit
Admission: RE | Admit: 2024-06-13 | Discharge: 2024-06-13 | Disposition: A | Discharge status: Home / Self Care | Source: Ambulatory Visit | Attending: Radiation Oncology | Admitting: Radiation Oncology

## 2024-06-13 ENCOUNTER — Other Ambulatory Visit: Payer: Self-pay

## 2024-06-13 DIAGNOSIS — Z510 Encounter for antineoplastic radiation therapy: Secondary | ICD-10-CM | POA: Diagnosis not present

## 2024-06-13 LAB — RAD ONC ARIA SESSION SUMMARY
Course Elapsed Days: 17
Plan Fractions Treated to Date: 13
Plan Prescribed Dose Per Fraction: 1.8 Gy
Plan Total Fractions Prescribed: 25
Plan Total Prescribed Dose: 45 Gy
Reference Point Dosage Given to Date: 23.4 Gy
Reference Point Session Dosage Given: 1.8 Gy
Session Number: 13

## 2024-06-16 ENCOUNTER — Other Ambulatory Visit: Payer: Self-pay

## 2024-06-16 ENCOUNTER — Ambulatory Visit
Admission: RE | Admit: 2024-06-16 | Discharge: 2024-06-16 | Disposition: A | Discharge status: Home / Self Care | Source: Ambulatory Visit | Attending: Radiation Oncology | Admitting: Radiation Oncology

## 2024-06-16 DIAGNOSIS — Z510 Encounter for antineoplastic radiation therapy: Secondary | ICD-10-CM | POA: Diagnosis not present

## 2024-06-16 LAB — RAD ONC ARIA SESSION SUMMARY
Course Elapsed Days: 20
Plan Fractions Treated to Date: 14
Plan Prescribed Dose Per Fraction: 1.8 Gy
Plan Total Fractions Prescribed: 25
Plan Total Prescribed Dose: 45 Gy
Reference Point Dosage Given to Date: 25.2 Gy
Reference Point Session Dosage Given: 1.8 Gy
Session Number: 14

## 2024-06-17 ENCOUNTER — Ambulatory Visit
Admission: RE | Admit: 2024-06-17 | Discharge: 2024-06-17 | Disposition: A | Discharge status: Home / Self Care | Source: Ambulatory Visit | Attending: Radiation Oncology | Admitting: Radiation Oncology

## 2024-06-17 ENCOUNTER — Other Ambulatory Visit: Payer: Self-pay

## 2024-06-17 DIAGNOSIS — Z191 Hormone sensitive malignancy status: Secondary | ICD-10-CM | POA: Diagnosis not present

## 2024-06-17 DIAGNOSIS — C61 Malignant neoplasm of prostate: Secondary | ICD-10-CM | POA: Diagnosis not present

## 2024-06-17 DIAGNOSIS — Z510 Encounter for antineoplastic radiation therapy: Secondary | ICD-10-CM | POA: Diagnosis not present

## 2024-06-17 LAB — RAD ONC ARIA SESSION SUMMARY
Course Elapsed Days: 21
Plan Fractions Treated to Date: 15
Plan Prescribed Dose Per Fraction: 1.8 Gy
Plan Total Fractions Prescribed: 25
Plan Total Prescribed Dose: 45 Gy
Reference Point Dosage Given to Date: 27 Gy
Reference Point Session Dosage Given: 1.8 Gy
Session Number: 15

## 2024-06-18 ENCOUNTER — Other Ambulatory Visit: Payer: Self-pay

## 2024-06-18 ENCOUNTER — Ambulatory Visit
Admission: RE | Admit: 2024-06-18 | Discharge: 2024-06-18 | Disposition: A | Discharge status: Home / Self Care | Source: Ambulatory Visit | Attending: Radiation Oncology | Admitting: Radiation Oncology

## 2024-06-18 DIAGNOSIS — Z510 Encounter for antineoplastic radiation therapy: Secondary | ICD-10-CM | POA: Diagnosis not present

## 2024-06-18 LAB — RAD ONC ARIA SESSION SUMMARY
Course Elapsed Days: 22
Plan Fractions Treated to Date: 16
Plan Prescribed Dose Per Fraction: 1.8 Gy
Plan Total Fractions Prescribed: 25
Plan Total Prescribed Dose: 45 Gy
Reference Point Dosage Given to Date: 28.8 Gy
Reference Point Session Dosage Given: 1.8 Gy
Session Number: 16

## 2024-06-19 ENCOUNTER — Ambulatory Visit
Admission: RE | Admit: 2024-06-19 | Discharge: 2024-06-19 | Disposition: A | Discharge status: Home / Self Care | Source: Ambulatory Visit | Attending: Radiation Oncology

## 2024-06-19 ENCOUNTER — Ambulatory Visit
Admission: RE | Admit: 2024-06-19 | Discharge: 2024-06-19 | Disposition: A | Discharge status: Home / Self Care | Source: Ambulatory Visit | Attending: Radiation Oncology | Admitting: Radiation Oncology

## 2024-06-19 ENCOUNTER — Other Ambulatory Visit: Payer: Self-pay

## 2024-06-19 DIAGNOSIS — Z510 Encounter for antineoplastic radiation therapy: Secondary | ICD-10-CM | POA: Diagnosis not present

## 2024-06-19 LAB — RAD ONC ARIA SESSION SUMMARY
Course Elapsed Days: 23
Plan Fractions Treated to Date: 17
Plan Prescribed Dose Per Fraction: 1.8 Gy
Plan Total Fractions Prescribed: 25
Plan Total Prescribed Dose: 45 Gy
Reference Point Dosage Given to Date: 30.6 Gy
Reference Point Session Dosage Given: 1.8 Gy
Session Number: 17

## 2024-06-20 ENCOUNTER — Ambulatory Visit

## 2024-06-20 ENCOUNTER — Other Ambulatory Visit: Payer: Self-pay

## 2024-06-20 ENCOUNTER — Ambulatory Visit
Admission: RE | Admit: 2024-06-20 | Discharge: 2024-06-20 | Disposition: A | Discharge status: Home / Self Care | Source: Ambulatory Visit | Attending: Radiation Oncology | Admitting: Radiation Oncology

## 2024-06-20 DIAGNOSIS — Z510 Encounter for antineoplastic radiation therapy: Secondary | ICD-10-CM | POA: Diagnosis not present

## 2024-06-20 LAB — RAD ONC ARIA SESSION SUMMARY
Course Elapsed Days: 24
Plan Fractions Treated to Date: 18
Plan Prescribed Dose Per Fraction: 1.8 Gy
Plan Total Fractions Prescribed: 25
Plan Total Prescribed Dose: 45 Gy
Reference Point Dosage Given to Date: 32.4 Gy
Reference Point Session Dosage Given: 1.8 Gy
Session Number: 18

## 2024-06-23 ENCOUNTER — Ambulatory Visit
Admission: RE | Admit: 2024-06-23 | Discharge: 2024-06-23 | Disposition: A | Discharge status: Home / Self Care | Source: Ambulatory Visit | Attending: Radiation Oncology | Admitting: Radiation Oncology

## 2024-06-23 ENCOUNTER — Other Ambulatory Visit: Payer: Self-pay

## 2024-06-23 DIAGNOSIS — Z510 Encounter for antineoplastic radiation therapy: Secondary | ICD-10-CM | POA: Diagnosis not present

## 2024-06-23 LAB — RAD ONC ARIA SESSION SUMMARY
Course Elapsed Days: 27
Plan Fractions Treated to Date: 19
Plan Prescribed Dose Per Fraction: 1.8 Gy
Plan Total Fractions Prescribed: 25
Plan Total Prescribed Dose: 45 Gy
Reference Point Dosage Given to Date: 34.2 Gy
Reference Point Session Dosage Given: 1.8 Gy
Session Number: 19

## 2024-06-24 ENCOUNTER — Ambulatory Visit

## 2024-06-25 ENCOUNTER — Other Ambulatory Visit: Payer: Self-pay

## 2024-06-25 ENCOUNTER — Ambulatory Visit
Admission: RE | Admit: 2024-06-25 | Discharge: 2024-06-25 | Disposition: A | Discharge status: Home / Self Care | Source: Ambulatory Visit | Attending: Radiation Oncology | Admitting: Radiation Oncology

## 2024-06-25 DIAGNOSIS — C61 Malignant neoplasm of prostate: Secondary | ICD-10-CM | POA: Diagnosis not present

## 2024-06-25 DIAGNOSIS — Z510 Encounter for antineoplastic radiation therapy: Secondary | ICD-10-CM | POA: Diagnosis not present

## 2024-06-25 DIAGNOSIS — Z191 Hormone sensitive malignancy status: Secondary | ICD-10-CM | POA: Diagnosis not present

## 2024-06-25 LAB — RAD ONC ARIA SESSION SUMMARY
Course Elapsed Days: 29
Plan Fractions Treated to Date: 20
Plan Prescribed Dose Per Fraction: 1.8 Gy
Plan Total Fractions Prescribed: 25
Plan Total Prescribed Dose: 45 Gy
Reference Point Dosage Given to Date: 36 Gy
Reference Point Session Dosage Given: 1.8 Gy
Session Number: 20

## 2024-06-26 ENCOUNTER — Ambulatory Visit
Admission: RE | Admit: 2024-06-26 | Discharge: 2024-06-26 | Disposition: A | Discharge status: Home / Self Care | Source: Ambulatory Visit | Attending: Radiation Oncology

## 2024-06-26 ENCOUNTER — Other Ambulatory Visit: Payer: Self-pay

## 2024-06-26 DIAGNOSIS — Z510 Encounter for antineoplastic radiation therapy: Secondary | ICD-10-CM | POA: Diagnosis not present

## 2024-06-26 LAB — RAD ONC ARIA SESSION SUMMARY
Course Elapsed Days: 30
Plan Fractions Treated to Date: 21
Plan Prescribed Dose Per Fraction: 1.8 Gy
Plan Total Fractions Prescribed: 25
Plan Total Prescribed Dose: 45 Gy
Reference Point Dosage Given to Date: 37.8 Gy
Reference Point Session Dosage Given: 1.8 Gy
Session Number: 21

## 2024-06-27 ENCOUNTER — Ambulatory Visit
Admission: RE | Admit: 2024-06-27 | Discharge: 2024-06-27 | Disposition: A | Discharge status: Home / Self Care | Source: Ambulatory Visit | Attending: Radiation Oncology | Admitting: Radiation Oncology

## 2024-06-27 ENCOUNTER — Other Ambulatory Visit: Payer: Self-pay

## 2024-06-27 DIAGNOSIS — Z510 Encounter for antineoplastic radiation therapy: Secondary | ICD-10-CM | POA: Diagnosis not present

## 2024-06-27 LAB — RAD ONC ARIA SESSION SUMMARY
Course Elapsed Days: 31
Plan Fractions Treated to Date: 22
Plan Prescribed Dose Per Fraction: 1.8 Gy
Plan Total Fractions Prescribed: 25
Plan Total Prescribed Dose: 45 Gy
Reference Point Dosage Given to Date: 39.6 Gy
Reference Point Session Dosage Given: 1.8 Gy
Session Number: 22

## 2024-06-30 ENCOUNTER — Ambulatory Visit
Admission: RE | Admit: 2024-06-30 | Discharge: 2024-06-30 | Disposition: A | Discharge status: Home / Self Care | Source: Ambulatory Visit | Attending: Radiation Oncology

## 2024-06-30 ENCOUNTER — Other Ambulatory Visit: Payer: Self-pay

## 2024-06-30 DIAGNOSIS — Z510 Encounter for antineoplastic radiation therapy: Secondary | ICD-10-CM | POA: Diagnosis not present

## 2024-06-30 LAB — RAD ONC ARIA SESSION SUMMARY
Course Elapsed Days: 34
Plan Fractions Treated to Date: 23
Plan Prescribed Dose Per Fraction: 1.8 Gy
Plan Total Fractions Prescribed: 25
Plan Total Prescribed Dose: 45 Gy
Reference Point Dosage Given to Date: 41.4 Gy
Reference Point Session Dosage Given: 1.8 Gy
Session Number: 23

## 2024-07-01 ENCOUNTER — Ambulatory Visit
Admission: RE | Admit: 2024-07-01 | Discharge: 2024-07-01 | Disposition: A | Discharge status: Home / Self Care | Source: Ambulatory Visit | Attending: Radiation Oncology | Admitting: Radiation Oncology

## 2024-07-01 ENCOUNTER — Other Ambulatory Visit: Payer: Self-pay

## 2024-07-01 DIAGNOSIS — Z510 Encounter for antineoplastic radiation therapy: Secondary | ICD-10-CM | POA: Diagnosis not present

## 2024-07-01 LAB — RAD ONC ARIA SESSION SUMMARY
Course Elapsed Days: 35
Plan Fractions Treated to Date: 24
Plan Prescribed Dose Per Fraction: 1.8 Gy
Plan Total Fractions Prescribed: 25
Plan Total Prescribed Dose: 45 Gy
Reference Point Dosage Given to Date: 43.2 Gy
Reference Point Session Dosage Given: 1.8 Gy
Session Number: 24

## 2024-07-02 ENCOUNTER — Other Ambulatory Visit: Payer: Self-pay

## 2024-07-02 ENCOUNTER — Ambulatory Visit
Admission: RE | Admit: 2024-07-02 | Discharge: 2024-07-02 | Disposition: A | Discharge status: Home / Self Care | Source: Ambulatory Visit | Attending: Radiation Oncology | Admitting: Radiation Oncology

## 2024-07-02 DIAGNOSIS — Z191 Hormone sensitive malignancy status: Secondary | ICD-10-CM | POA: Diagnosis not present

## 2024-07-02 DIAGNOSIS — Z510 Encounter for antineoplastic radiation therapy: Secondary | ICD-10-CM | POA: Diagnosis not present

## 2024-07-02 DIAGNOSIS — C61 Malignant neoplasm of prostate: Secondary | ICD-10-CM | POA: Diagnosis not present

## 2024-07-02 LAB — RAD ONC ARIA SESSION SUMMARY
Course Elapsed Days: 36
Plan Fractions Treated to Date: 25
Plan Prescribed Dose Per Fraction: 1.8 Gy
Plan Total Fractions Prescribed: 25
Plan Total Prescribed Dose: 45 Gy
Reference Point Dosage Given to Date: 45 Gy
Reference Point Session Dosage Given: 1.8 Gy
Session Number: 25

## 2024-07-03 ENCOUNTER — Ambulatory Visit
Admission: RE | Admit: 2024-07-03 | Discharge: 2024-07-03 | Disposition: A | Discharge status: Home / Self Care | Source: Ambulatory Visit | Attending: Radiation Oncology

## 2024-07-03 ENCOUNTER — Other Ambulatory Visit: Payer: Self-pay

## 2024-07-03 DIAGNOSIS — Z510 Encounter for antineoplastic radiation therapy: Secondary | ICD-10-CM | POA: Diagnosis not present

## 2024-07-03 LAB — RAD ONC ARIA SESSION SUMMARY
Course Elapsed Days: 37
Plan Fractions Treated to Date: 1
Plan Prescribed Dose Per Fraction: 2 Gy
Plan Total Fractions Prescribed: 15
Plan Total Prescribed Dose: 30 Gy
Reference Point Dosage Given to Date: 2 Gy
Reference Point Session Dosage Given: 2 Gy
Session Number: 26

## 2024-07-04 ENCOUNTER — Other Ambulatory Visit: Payer: Self-pay

## 2024-07-04 ENCOUNTER — Ambulatory Visit
Admission: RE | Admit: 2024-07-04 | Discharge: 2024-07-04 | Disposition: A | Discharge status: Home / Self Care | Source: Ambulatory Visit | Attending: Radiation Oncology | Admitting: Radiation Oncology

## 2024-07-04 DIAGNOSIS — Z510 Encounter for antineoplastic radiation therapy: Secondary | ICD-10-CM | POA: Diagnosis not present

## 2024-07-04 LAB — RAD ONC ARIA SESSION SUMMARY
Course Elapsed Days: 38
Plan Fractions Treated to Date: 2
Plan Prescribed Dose Per Fraction: 2 Gy
Plan Total Fractions Prescribed: 15
Plan Total Prescribed Dose: 30 Gy
Reference Point Dosage Given to Date: 4 Gy
Reference Point Session Dosage Given: 2 Gy
Session Number: 27

## 2024-07-06 NOTE — Progress Notes (Signed)
 Cardiology Office Note    Date:  07/16/2024   ID:  Ricardo Arnold, DOB 13-Apr-1951, MRN [id number]  PCP:  Pcp, No  Cardiologist:  Dr. Jayvier Burgher [place]  Chief Complaint  Patient presents with   Coronary Artery Disease    History of Present Illness:  Ricardo Arnold is a 73 y.o. male with PMH of CAD and HLD.  Patient had a history of lateral MI in March 2004 treated with Cypher 3.0 x 18 mm DES of the LCx.  Cardiac catheterization in 2004 showed patent stent but mild to moderate diffuse coronary artery atherosclerosis.  Follow-up nuclear stress test in August 2015 showed fixed lateral defect with no ischemia.  EF 50%.  Plavix  was discontinued in August 2013 after upper GI bleed.  Follow-up ETT obtained on 05/30/2017 showed no ST segment deviation during stress, overall considered normal ETT.    Since his last visit he has been diagnosed with  prostate CA with nodal involvement. Now undergoing RT and is on hormonal therapy. No cardiac complaints. Does note with above he got confused about lipid therapy. Was also basically at bed rest for a month. Now back active and is taking medication correctly. This may explain increase in LDL in July.   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   treated with a 3.0 x 18 mm Cypher stent/    Hyperlipidemia    Hypokinesia    with EF of 49%  /by  cardiolite  study   Ischemic heart disease    Upper GI bleed     Past Surgical History:  Procedure Laterality Date   CARDIAC CATHETERIZATION  03/09/2003   EF  50%    cardiac stents     2004   ESOPHAGOGASTRODUODENOSCOPY  05/15/2012   Procedure: ESOPHAGOGASTRODUODENOSCOPY (EGD);  Surgeon: Princella CHRISTELLA Nida, MD;  Location: [facility];  Service: Endoscopy;  Laterality: N/A;    PROSTATE BIOPSY     SKIN GRAFT  10/09/2010   TONSILLECTOMY      Current Medications: Outpatient Medications Prior to Visit  Medication Sig Dispense Refill   aspirin  81 MG EC tablet Take 1 tablet (81 mg total) by mouth daily. Swallow whole.     calcium  carbonate (TUMS - DOSED IN MG ELEMENTAL CALCIUM ) 500 MG chewable tablet Chew 2 tablets by mouth daily.     Cholecalciferol (VITAMIN D-3 PO) Take by mouth daily.     ERLEADA 60 MG tablet Take 240 mg by mouth daily.     ezetimibe  (ZETIA ) 10 MG tablet TAKE 1 TABLET BY MOUTH EVERY DAY 90 tablet 2   finasteride (PROSCAR) 5 MG tablet Take 5 mg by mouth daily.     Multiple Vitamins-Minerals (CENTRUM SILVER PO) Take 1 tablet by mouth daily.     rosuvastatin  (CRESTOR ) 40 MG tablet Take 1 tablet (40 mg total) by mouth daily. 90 tablet 0   tamsulosin (FLOMAX) 0.4 MG CAPS capsule Take 0.4 mg by mouth 2 (two) times daily.     No facility-administered medications prior to visit.     Allergies:   Patient has no known allergies.   Social History   Socioeconomic History   Marital status:  Married    Spouse name: Not on file   Number of children: 1   Years of education: Not on file   Highest education level: Not on file  Occupational History   Occupation: Art gallery manager    Employer: [employer]  Tobacco Use   Smoking status: Former    Current packs/day: 0.00    Average packs/day: 1 pack/day for 32.0 years (32.0 ttl pk-yrs)    Types: Cigarettes    Start date: 10/09/1970    Quit date: 10/09/2002    Years since quitting: 21.7   Smokeless tobacco: Never  Vaping Use   Vaping status: Never Used  Substance and Sexual Activity   Alcohol use: Yes    Comment: 2 drinks a day 2-3 times per week.   Drug use: No   Sexual activity: Not on file  Other Topics Concern   Not on file  Social History Narrative   Not on file   Social Drivers of Health   Financial Resource Strain: Not on file  Food Insecurity: No Food Insecurity (05/09/2024)   Hunger Vital Sign     Worried About Running Out of Food in the Last Year: Never true    Ran Out of Food in the Last Year: Never true  Transportation Needs: No Transportation Needs (05/09/2024)   PRAPARE - Administrator, Civil Service (Medical): No    Lack of Transportation (Non-Medical): No  Physical Activity: Not on file  Stress: Not on file  Social Connections: Not on file     Family History:  The patient's family history includes Coronary artery disease in his father; Heart attack in his father; Heart disease in his father.   ROS:   Please see the history of present illness.    ROS All other systems reviewed and are negative.   PHYSICAL EXAM:   VS:  BP 110/70 (BP Location: Left Arm, Patient Position: Sitting, Cuff Size: Normal)   Pulse 67   Ht 6' (1.829 m)   Wt 173 lb (78.5 kg)   SpO2 98%   BMI 23.46 kg/m    GEN: Well nourished, well developed, in no acute distress  HEENT: normal  Neck: no JVD, carotid bruits, or masses Cardiac: RRR; no murmurs, rubs, or gallops,no edema  Respiratory:  clear to auscultation bilaterally, normal work of breathing GI: soft, nontender, nondistended, + BS MS: no deformity or atrophy  Skin: warm and dry, no rash Neuro:  Alert and Oriented x 3, Strength and sensation are intact Psych: euthymic mood, full affect  Wt Readings from Last 3 Encounters:  07/16/24 173 lb (78.5 kg)  05/09/24 175 lb 12.8 oz (79.7 kg)  07/20/23 178 lb (80.7 kg)      Studies/Labs Reviewed:   EKG Interpretation Date/Time:  Wednesday July 16 2024 16:24:11 EDT Ventricular Rate:  67 PR Interval:  156 QRS Duration:  104 QT Interval:  430 QTC Calculation: 454 R Axis:   -44  Text Interpretation: Normal sinus rhythm Left anterior fasicular block Low voltage QRS When compared with ECG of 20-Jul-2023 13:49, No significant change was found Confirmed by [place], Talah Cookston [phone]) on 07/16/2024 4:31:16 PM  EKG Interpretation Date/Time:  Wednesday July 16 2024 16:24:11  EDT Ventricular Rate:  67 PR Interval:  156 QRS Duration:  104 QT Interval:  430 QTC Calculation: 454 R Axis:   -44  Text Interpretation: Normal sinus rhythm Left anterior fasicular block Low voltage QRS When compared with ECG of 20-Jul-2023 13:49, No significant change was found Confirmed  by [place], Jannis Atkins [phone]) on 07/16/2024 4:31:16 PM  EKG Interpretation Date/Time:  Wednesday July 16 2024 16:24:11 EDT Ventricular Rate:  67 PR Interval:  156 QRS Duration:  104 QT Interval:  430 QTC Calculation: 454 R Axis:   -44  Text Interpretation: Normal sinus rhythm Left anterior fasicular block Low voltage QRS When compared with ECG of 20-Jul-2023 13:49, No significant change was found Confirmed by [place], Matia Zelada [phone]) on 07/16/2024 4:31:16 PM     Recent Labs: 05/08/2024: ALT 22; BUN 12; Creatinine, Ser 0.97; Potassium 4.4; Sodium 142   Lipid Panel    Component Value Date/Time   CHOL 207 (H) 05/08/2024 0900   TRIG 171 (H) 05/08/2024 0900   HDL 52 05/08/2024 0900   CHOLHDL 4.0 05/08/2024 0900   CHOLHDL 3.1 02/23/2016 0901   VLDL 19 02/23/2016 0901   LDLCALC 125 (H) 05/08/2024 0900    Additional studies/ records that were reviewed today include:   ETT 05/30/2017 Study Highlights     Blood pressure demonstrated a normal response to exercise. There was no ST segment deviation noted during stress.   Normal ETT No ischemia     ASSESSMENT:    1. Ischemic heart disease   2. Coronary artery disease involving native coronary artery of native heart without angina pectoris   3. Hyperlipidemia LDL goal <70       PLAN:  In order of problems listed above:  CAD: remote stent of LCx in 2004. Continue aspirin  and statin.  He is asymptomatic.  Hyperlipidemia: last LDL up to 124. On high dose Crestor  and Zetia  now. Will repeat lipid levels. If not at goal consider Repatha.   Follow up in one year    Medication Adjustments/Labs and Tests Ordered: Current medicines are  reviewed at length with the patient today.  Concerns regarding medicines are outlined above.  Medication changes, Labs and Tests ordered today are listed in the Patient Instructions below. Patient Instructions  Medication Instructions:  Continue same medications *If you need a refill on your cardiac medications before your next appointment, please call your pharmacy*  Lab Work: Lipid panel,cmet   Testing/Procedures: None ordered  Follow-Up: At [facility], you and your health needs are our priority.  As part of our continuing mission to provide you with exceptional heart care, our providers are all part of one team.  This team includes your primary Cardiologist (physician) and Advanced Practice Providers or APPs (Physician Assistants and Nurse Practitioners) who all work together to provide you with the care you need, when you need it.  Your next appointment:  1 year  Call in May to schedule Oct appointment     Provider:  Dr.Naksh Radi   We recommend signing up for the patient portal called MyChart.  Sign up information is provided on this After Visit Summary.  MyChart is used to connect with patients for Virtual Visits (Telemedicine).  Patients are able to view lab/test results, encounter notes, upcoming appointments, etc.  Non-urgent messages can be sent to your provider as well.   To learn more about what you can do with MyChart, go to [email or web address].       Signed, Summar Mcglothlin [place], MD  07/16/2024 5:16 PM    [facility]

## 2024-07-07 ENCOUNTER — Other Ambulatory Visit: Payer: Self-pay

## 2024-07-07 ENCOUNTER — Ambulatory Visit
Admission: RE | Admit: 2024-07-07 | Discharge: 2024-07-07 | Disposition: A | Discharge status: Home / Self Care | Source: Ambulatory Visit | Attending: Radiation Oncology | Admitting: Radiation Oncology

## 2024-07-07 DIAGNOSIS — Z510 Encounter for antineoplastic radiation therapy: Secondary | ICD-10-CM | POA: Diagnosis not present

## 2024-07-07 LAB — RAD ONC ARIA SESSION SUMMARY
Course Elapsed Days: 41
Plan Fractions Treated to Date: 3
Plan Prescribed Dose Per Fraction: 2 Gy
Plan Total Fractions Prescribed: 15
Plan Total Prescribed Dose: 30 Gy
Reference Point Dosage Given to Date: 6 Gy
Reference Point Session Dosage Given: 2 Gy
Session Number: 28

## 2024-07-08 ENCOUNTER — Ambulatory Visit
Admission: RE | Admit: 2024-07-08 | Discharge: 2024-07-08 | Disposition: A | Discharge status: Home / Self Care | Source: Ambulatory Visit | Attending: Radiation Oncology | Admitting: Radiation Oncology

## 2024-07-08 ENCOUNTER — Other Ambulatory Visit: Payer: Self-pay

## 2024-07-08 DIAGNOSIS — Z510 Encounter for antineoplastic radiation therapy: Secondary | ICD-10-CM | POA: Diagnosis not present

## 2024-07-08 LAB — RAD ONC ARIA SESSION SUMMARY
Course Elapsed Days: 42
Plan Fractions Treated to Date: 4
Plan Prescribed Dose Per Fraction: 2 Gy
Plan Total Fractions Prescribed: 15
Plan Total Prescribed Dose: 30 Gy
Reference Point Dosage Given to Date: 8 Gy
Reference Point Session Dosage Given: 2 Gy
Session Number: 29

## 2024-07-09 ENCOUNTER — Ambulatory Visit
Admission: RE | Admit: 2024-07-09 | Discharge: 2024-07-09 | Disposition: A | Discharge status: Home / Self Care | Source: Ambulatory Visit | Attending: Radiation Oncology | Admitting: Radiation Oncology

## 2024-07-09 ENCOUNTER — Other Ambulatory Visit: Payer: Self-pay

## 2024-07-09 DIAGNOSIS — C61 Malignant neoplasm of prostate: Secondary | ICD-10-CM | POA: Diagnosis not present

## 2024-07-09 DIAGNOSIS — C775 Secondary and unspecified malignant neoplasm of intrapelvic lymph nodes: Secondary | ICD-10-CM | POA: Insufficient documentation

## 2024-07-09 DIAGNOSIS — Z191 Hormone sensitive malignancy status: Secondary | ICD-10-CM | POA: Diagnosis not present

## 2024-07-09 DIAGNOSIS — Z510 Encounter for antineoplastic radiation therapy: Secondary | ICD-10-CM | POA: Diagnosis present

## 2024-07-09 LAB — RAD ONC ARIA SESSION SUMMARY
Course Elapsed Days: 43
Plan Fractions Treated to Date: 5
Plan Prescribed Dose Per Fraction: 2 Gy
Plan Total Fractions Prescribed: 15
Plan Total Prescribed Dose: 30 Gy
Reference Point Dosage Given to Date: 10 Gy
Reference Point Session Dosage Given: 2 Gy
Session Number: 30

## 2024-07-10 ENCOUNTER — Other Ambulatory Visit: Payer: Self-pay

## 2024-07-10 ENCOUNTER — Ambulatory Visit
Admission: RE | Admit: 2024-07-10 | Discharge: 2024-07-10 | Disposition: A | Discharge status: Home / Self Care | Source: Ambulatory Visit | Attending: Radiation Oncology | Admitting: Radiation Oncology

## 2024-07-10 DIAGNOSIS — Z510 Encounter for antineoplastic radiation therapy: Secondary | ICD-10-CM | POA: Diagnosis not present

## 2024-07-10 LAB — RAD ONC ARIA SESSION SUMMARY
Course Elapsed Days: 44
Plan Fractions Treated to Date: 6
Plan Prescribed Dose Per Fraction: 2 Gy
Plan Total Fractions Prescribed: 15
Plan Total Prescribed Dose: 30 Gy
Reference Point Dosage Given to Date: 12 Gy
Reference Point Session Dosage Given: 2 Gy
Session Number: 31

## 2024-07-11 ENCOUNTER — Ambulatory Visit
Admission: RE | Admit: 2024-07-11 | Discharge: 2024-07-11 | Disposition: A | Discharge status: Home / Self Care | Source: Ambulatory Visit | Attending: Radiation Oncology | Admitting: Radiation Oncology

## 2024-07-11 ENCOUNTER — Other Ambulatory Visit: Payer: Self-pay

## 2024-07-11 DIAGNOSIS — Z510 Encounter for antineoplastic radiation therapy: Secondary | ICD-10-CM | POA: Diagnosis not present

## 2024-07-11 DIAGNOSIS — C775 Secondary and unspecified malignant neoplasm of intrapelvic lymph nodes: Secondary | ICD-10-CM | POA: Diagnosis not present

## 2024-07-11 DIAGNOSIS — C61 Malignant neoplasm of prostate: Secondary | ICD-10-CM | POA: Diagnosis not present

## 2024-07-11 LAB — RAD ONC ARIA SESSION SUMMARY
Course Elapsed Days: 45
Plan Fractions Treated to Date: 7
Plan Prescribed Dose Per Fraction: 2 Gy
Plan Total Fractions Prescribed: 15
Plan Total Prescribed Dose: 30 Gy
Reference Point Dosage Given to Date: 14 Gy
Reference Point Session Dosage Given: 2 Gy
Session Number: 32

## 2024-07-14 ENCOUNTER — Ambulatory Visit
Admission: RE | Admit: 2024-07-14 | Discharge: 2024-07-14 | Disposition: A | Source: Ambulatory Visit | Attending: Radiation Oncology

## 2024-07-14 ENCOUNTER — Other Ambulatory Visit: Payer: Self-pay

## 2024-07-14 DIAGNOSIS — C775 Secondary and unspecified malignant neoplasm of intrapelvic lymph nodes: Secondary | ICD-10-CM | POA: Diagnosis not present

## 2024-07-14 DIAGNOSIS — Z510 Encounter for antineoplastic radiation therapy: Secondary | ICD-10-CM | POA: Diagnosis not present

## 2024-07-14 DIAGNOSIS — C61 Malignant neoplasm of prostate: Secondary | ICD-10-CM | POA: Diagnosis not present

## 2024-07-14 LAB — RAD ONC ARIA SESSION SUMMARY
Course Elapsed Days: 48
Plan Fractions Treated to Date: 8
Plan Prescribed Dose Per Fraction: 2 Gy
Plan Total Fractions Prescribed: 15
Plan Total Prescribed Dose: 30 Gy
Reference Point Dosage Given to Date: 16 Gy
Reference Point Session Dosage Given: 2 Gy
Session Number: 33

## 2024-07-15 ENCOUNTER — Other Ambulatory Visit: Payer: Self-pay

## 2024-07-15 ENCOUNTER — Ambulatory Visit
Admission: RE | Admit: 2024-07-15 | Discharge: 2024-07-15 | Disposition: A | Source: Ambulatory Visit | Attending: Radiation Oncology

## 2024-07-15 DIAGNOSIS — Z510 Encounter for antineoplastic radiation therapy: Secondary | ICD-10-CM | POA: Diagnosis not present

## 2024-07-15 LAB — RAD ONC ARIA SESSION SUMMARY
Course Elapsed Days: 49
Plan Fractions Treated to Date: 9
Plan Prescribed Dose Per Fraction: 2 Gy
Plan Total Fractions Prescribed: 15
Plan Total Prescribed Dose: 30 Gy
Reference Point Dosage Given to Date: 18 Gy
Reference Point Session Dosage Given: 2 Gy
Session Number: 34

## 2024-07-16 ENCOUNTER — Encounter: Payer: Self-pay | Admitting: Cardiology

## 2024-07-16 ENCOUNTER — Ambulatory Visit: Attending: Cardiology | Admitting: Cardiology

## 2024-07-16 ENCOUNTER — Other Ambulatory Visit: Payer: Self-pay

## 2024-07-16 ENCOUNTER — Ambulatory Visit
Admission: RE | Admit: 2024-07-16 | Discharge: 2024-07-16 | Disposition: A | Source: Ambulatory Visit | Attending: Radiation Oncology

## 2024-07-16 VITALS — BP 110/70 | HR 67 | Ht 72.0 in | Wt 173.0 lb

## 2024-07-16 DIAGNOSIS — I259 Chronic ischemic heart disease, unspecified: Secondary | ICD-10-CM

## 2024-07-16 DIAGNOSIS — I2510 Atherosclerotic heart disease of native coronary artery without angina pectoris: Secondary | ICD-10-CM

## 2024-07-16 DIAGNOSIS — Z510 Encounter for antineoplastic radiation therapy: Secondary | ICD-10-CM | POA: Diagnosis not present

## 2024-07-16 DIAGNOSIS — E785 Hyperlipidemia, unspecified: Secondary | ICD-10-CM

## 2024-07-16 DIAGNOSIS — C61 Malignant neoplasm of prostate: Secondary | ICD-10-CM | POA: Diagnosis not present

## 2024-07-16 LAB — RAD ONC ARIA SESSION SUMMARY
Course Elapsed Days: 50
Plan Fractions Treated to Date: 10
Plan Prescribed Dose Per Fraction: 2 Gy
Plan Total Fractions Prescribed: 15
Plan Total Prescribed Dose: 30 Gy
Reference Point Dosage Given to Date: 20 Gy
Reference Point Session Dosage Given: 2 Gy
Session Number: 35

## 2024-07-16 NOTE — Patient Instructions (Signed)
 Medication Instructions:  Continue same medications *If you need a refill on your cardiac medications before your next appointment, please call your pharmacy*  Lab Work: Lipid panel,cmet   Testing/Procedures: None ordered  Follow-Up: At [facility], you and your health needs are our priority.  As part of our continuing mission to provide you with exceptional heart care, our providers are all part of one team.  This team includes your primary Cardiologist (physician) and Advanced Practice Providers or APPs (Physician Assistants and Nurse Practitioners) who all work together to provide you with the care you need, when you need it.  Your next appointment:  1 year  Call in May to schedule Oct appointment     Provider:  Dr.Jordan   We recommend signing up for the patient portal called MyChart.  Sign up information is provided on this After Visit Summary.  MyChart is used to connect with patients for Virtual Visits (Telemedicine).  Patients are able to view lab/test results, encounter notes, upcoming appointments, etc.  Non-urgent messages can be sent to your provider as well.   To learn more about what you can do with MyChart, go to [email or web address].

## 2024-07-17 ENCOUNTER — Other Ambulatory Visit: Payer: Self-pay

## 2024-07-17 ENCOUNTER — Ambulatory Visit
Admission: RE | Admit: 2024-07-17 | Discharge: 2024-07-17 | Disposition: A | Discharge status: Home / Self Care | Source: Ambulatory Visit | Attending: Radiation Oncology | Admitting: Radiation Oncology

## 2024-07-17 DIAGNOSIS — I259 Chronic ischemic heart disease, unspecified: Secondary | ICD-10-CM | POA: Diagnosis not present

## 2024-07-17 DIAGNOSIS — C775 Secondary and unspecified malignant neoplasm of intrapelvic lymph nodes: Secondary | ICD-10-CM | POA: Diagnosis not present

## 2024-07-17 DIAGNOSIS — Z510 Encounter for antineoplastic radiation therapy: Secondary | ICD-10-CM | POA: Diagnosis not present

## 2024-07-17 LAB — RAD ONC ARIA SESSION SUMMARY
Course Elapsed Days: 51
Plan Fractions Treated to Date: 11
Plan Prescribed Dose Per Fraction: 2 Gy
Plan Total Fractions Prescribed: 15
Plan Total Prescribed Dose: 30 Gy
Reference Point Dosage Given to Date: 22 Gy
Reference Point Session Dosage Given: 0.9969 Gy
Session Number: 36

## 2024-07-17 LAB — LIPID PANEL

## 2024-07-18 ENCOUNTER — Other Ambulatory Visit: Payer: Self-pay

## 2024-07-18 ENCOUNTER — Ambulatory Visit: Payer: Self-pay | Admitting: Cardiology

## 2024-07-18 ENCOUNTER — Ambulatory Visit

## 2024-07-18 ENCOUNTER — Ambulatory Visit
Admission: RE | Admit: 2024-07-18 | Discharge: 2024-07-18 | Disposition: A | Discharge status: Home / Self Care | Source: Ambulatory Visit | Attending: Radiation Oncology | Admitting: Radiation Oncology

## 2024-07-18 DIAGNOSIS — Z510 Encounter for antineoplastic radiation therapy: Secondary | ICD-10-CM | POA: Diagnosis not present

## 2024-07-18 LAB — LIPID PANEL
Cholesterol, Total: 203 mg/dL — AB (ref 100–199)
HDL: 58 mg/dL (ref >39)
LDL CALC COMMENT:: 3.5 ratio (ref 0.0–5.0)
LDL Chol Calc (NIH): 121 mg/dL — AB (ref 0–99)
Triglycerides: 135 mg/dL (ref 0–149)
VLDL Cholesterol Cal: 24 mg/dL (ref 5–40)

## 2024-07-18 LAB — COMPREHENSIVE METABOLIC PANEL WITH GFR
ALT: 17 IU/L (ref 0–44)
AST: 26 IU/L (ref 0–40)
Albumin: 4.2 g/dL (ref 3.8–4.8)
Alkaline Phosphatase: 86 IU/L (ref 47–123)
BUN/Creatinine Ratio: 13 (ref 10–24)
BUN: 12 mg/dL (ref 8–27)
Bilirubin Total: 0.3 mg/dL (ref 0.0–1.2)
CO2: 25 mmol/L (ref 20–29)
Calcium: 10 mg/dL (ref 8.6–10.2)
Chloride: 101 mmol/L (ref 96–106)
Creatinine, Ser: 0.93 mg/dL (ref 0.76–1.27)
Globulin, Total: 2.2 g/dL (ref 1.5–4.5)
Glucose: 95 mg/dL (ref 70–99)
Potassium: 5 mmol/L (ref 3.5–5.2)
Sodium: 140 mmol/L (ref 134–144)
Total Protein: 6.4 g/dL (ref 6.0–8.5)
eGFR: 88 mL/min/1.73 (ref >59)

## 2024-07-18 LAB — RAD ONC ARIA SESSION SUMMARY
Course Elapsed Days: 52
Plan Fractions Treated to Date: 12
Plan Prescribed Dose Per Fraction: 2 Gy
Plan Total Fractions Prescribed: 15
Plan Total Prescribed Dose: 30 Gy
Reference Point Dosage Given to Date: 24 Gy
Reference Point Session Dosage Given: 2 Gy
Session Number: 37

## 2024-07-21 ENCOUNTER — Ambulatory Visit
Admission: RE | Admit: 2024-07-21 | Discharge: 2024-07-21 | Disposition: A | Discharge status: Home / Self Care | Source: Ambulatory Visit | Attending: Radiation Oncology | Admitting: Radiation Oncology

## 2024-07-21 ENCOUNTER — Other Ambulatory Visit: Payer: Self-pay

## 2024-07-21 DIAGNOSIS — Z510 Encounter for antineoplastic radiation therapy: Secondary | ICD-10-CM | POA: Diagnosis not present

## 2024-07-21 DIAGNOSIS — C61 Malignant neoplasm of prostate: Secondary | ICD-10-CM | POA: Diagnosis not present

## 2024-07-21 DIAGNOSIS — C775 Secondary and unspecified malignant neoplasm of intrapelvic lymph nodes: Secondary | ICD-10-CM | POA: Diagnosis not present

## 2024-07-21 LAB — RAD ONC ARIA SESSION SUMMARY
Course Elapsed Days: 55
Plan Fractions Treated to Date: 13
Plan Prescribed Dose Per Fraction: 2 Gy
Plan Total Fractions Prescribed: 15
Plan Total Prescribed Dose: 30 Gy
Reference Point Dosage Given to Date: 26 Gy
Reference Point Session Dosage Given: 2 Gy
Session Number: 38

## 2024-07-22 ENCOUNTER — Other Ambulatory Visit: Payer: Self-pay

## 2024-07-22 ENCOUNTER — Ambulatory Visit
Admission: RE | Admit: 2024-07-22 | Discharge: 2024-07-22 | Disposition: A | Discharge status: Home / Self Care | Source: Ambulatory Visit | Attending: Radiation Oncology | Admitting: Radiation Oncology

## 2024-07-22 DIAGNOSIS — Z510 Encounter for antineoplastic radiation therapy: Secondary | ICD-10-CM | POA: Diagnosis not present

## 2024-07-22 LAB — RAD ONC ARIA SESSION SUMMARY
Course Elapsed Days: 56
Plan Fractions Treated to Date: 14
Plan Prescribed Dose Per Fraction: 2 Gy
Plan Total Fractions Prescribed: 15
Plan Total Prescribed Dose: 30 Gy
Reference Point Dosage Given to Date: 28 Gy
Reference Point Session Dosage Given: 2 Gy
Session Number: 39

## 2024-07-23 ENCOUNTER — Other Ambulatory Visit: Payer: Self-pay

## 2024-07-23 ENCOUNTER — Telehealth: Payer: Self-pay | Admitting: Cardiology

## 2024-07-23 ENCOUNTER — Ambulatory Visit: Admission: RE | Admit: 2024-07-23 | Discharge: 2024-07-23 | Attending: Radiation Oncology

## 2024-07-23 DIAGNOSIS — C61 Malignant neoplasm of prostate: Secondary | ICD-10-CM | POA: Diagnosis not present

## 2024-07-23 DIAGNOSIS — Z191 Hormone sensitive malignancy status: Secondary | ICD-10-CM | POA: Diagnosis not present

## 2024-07-23 DIAGNOSIS — C775 Secondary and unspecified malignant neoplasm of intrapelvic lymph nodes: Secondary | ICD-10-CM | POA: Diagnosis not present

## 2024-07-23 DIAGNOSIS — Z510 Encounter for antineoplastic radiation therapy: Secondary | ICD-10-CM | POA: Diagnosis not present

## 2024-07-23 DIAGNOSIS — E785 Hyperlipidemia, unspecified: Secondary | ICD-10-CM

## 2024-07-23 LAB — RAD ONC ARIA SESSION SUMMARY
Course Elapsed Days: 57
Plan Fractions Treated to Date: 15
Plan Prescribed Dose Per Fraction: 2 Gy
Plan Total Fractions Prescribed: 15
Plan Total Prescribed Dose: 30 Gy
Reference Point Dosage Given to Date: 30 Gy
Reference Point Session Dosage Given: 2 Gy
Session Number: 40

## 2024-07-23 NOTE — Telephone Encounter (Signed)
 Pt returning C. Pughs call for results. Please advise.

## 2024-07-24 NOTE — Telephone Encounter (Signed)
  Ricardo M [place], MD 07/18/2024  7:18 AM EDT     The following abnormalities are noted:  so his cholesterol is still not at goal on current therapy All other values are normal, stable or within acceptable limits. Medication changes / Follow up labs / Other changes or recommendations:   Would refer to lipid clinic for Repatha   Ricardo [place], MD 07/18/2024 7:17 AM      Left the information above over voicemail. Informed that a referral will be placed and he will receive a phone call to schedule an appointment with our pharmacy team. Left call back number.   Referral placed for PharmD- Lipids

## 2024-07-25 NOTE — Radiation Completion Notes (Addendum)
  Radiation Oncology         [phone] ________________________________  Name: Ricardo Arnold MRN: [id number]  Date: 07/23/2024  DOB: 1951/04/16  Referring Physician: VICTOR SHOWALTER, M.D. Date of Service: 2024-07-25 Radiation Oncologist: Adina Barge, M.D. [facility]     RADIATION ONCOLOGY END OF TREATMENT NOTE     Diagnosis: 73 y.o. 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   Intent: Curative     ==========DELIVERED PLANS==========  First Treatment Date: 2024-05-27 Last Treatment Date: 2024-07-23   Plan Name: Prostate_Pelv Site: Prostate Technique: IMRT Mode: Photon Dose Per Fraction: 1.8 Gy Prescribed Dose (Delivered / Prescribed): 45 Gy / 45 Gy Prescribed Fxs (Delivered / Prescribed): 25 / 25   Plan Name: Prostate_Bst Site: Prostate Technique: IMRT Mode: Photon Dose Per Fraction: 2 Gy Prescribed Dose (Delivered / Prescribed): 30 Gy / 30 Gy Prescribed Fxs (Delivered / Prescribed): 15 / 15     ==========ON TREATMENT VISIT DATES========== 2024-05-30, 2024-06-06, 2024-06-13, 2024-06-19, 2024-06-27, 2024-07-03, 2024-07-11, 2024-07-17   See weekly On Treatment Notes in Epic for details in the Media tab (listed as Progress notes on the On Treatment Visit Dates listed above).  He tolerated the daily radiation treatments relatively well with some increased nocturia that was managed with Flomax.  He also experienced some modest fatigue.  The patient will receive a call in about one month from the radiation oncology department. He will continue follow up with his urologist, Dr. Shane, as well.  ------------------------------------------------   Donnice Barge, MD [facility]  Radiation Oncology Direct Dial: [phone]  Fax: [phone] [email or web address]  Skype  LinkedIn

## 2024-07-30 ENCOUNTER — Other Ambulatory Visit: Payer: Self-pay

## 2024-07-30 DIAGNOSIS — E785 Hyperlipidemia, unspecified: Secondary | ICD-10-CM

## 2024-07-30 DIAGNOSIS — I2510 Atherosclerotic heart disease of native coronary artery without angina pectoris: Secondary | ICD-10-CM

## 2024-08-12 NOTE — Progress Notes (Signed)
  Radiation Oncology         (336) [phone] ________________________________  Name: Ricardo Arnold MRN: [id number]  Date of Service: 08/26/2024  DOB: 1950/12/05  Post Treatment Telephone Note  Diagnosis:  Malignant neoplasm of prostate   First Treatment Date: 2024-05-27 Last Treatment Date: 2024-07-23   Plan Name: Prostate_Pelv Site: Prostate Technique: IMRT Mode: Photon Dose Per Fraction: 1.8 Gy Prescribed Dose (Delivered / Prescribed): 45 Gy / 45 Gy Prescribed Fxs (Delivered / Prescribed): 25 / 25   Plan Name: Prostate_Bst Site: Prostate Technique: IMRT Mode: Photon Dose Per Fraction: 2 Gy Prescribed Dose (Delivered / Prescribed): 30 Gy / 30 Gy Prescribed Fxs (Delivered / Prescribed): 15 / 15 Pre Treatment IPSS Score: 16 (as documented in the provider consult note)  The patient was available for call today.   Symptoms of fatigue have improved since completing therapy.  Symptoms of bladder changes have improved since completing therapy. Current symptoms include nocturia, and medications for bladder symptoms include Flomax.  Symptoms of bowel changes have improved since completing therapy.   Post Treatment IPSS Score: 19 IPSS Questionnaire (AUA-7): Over the past month.   1)  How often have you had a sensation of not emptying your bladder completely after you finish urinating?  3 - About half the time  2)  How often have you had to urinate again less than two hours after you finished urinating? 3 - About half the time  3)  How often have you found you stopped and started again several times when you urinated?  0 - Not at all  4) How difficult have you found it to postpone urination?  3 - About half the time  5) How often have you had a weak urinary stream?  5 - Almost always  6) How often have you had to push or strain to begin urination?  0 - Not at all  7) How many times did you most typically get up to urinate from the time you went to bed until the time you got up in the  morning?  5 - 5+ times  Total score:  19. Which indicates moderate symptoms  0-7 mildly symptomatic   8-19 moderately symptomatic   20-35 severely symptomatic   Patient has a scheduled follow up visit with his urologist, Dr. Steffan Pea, on 08/27/24 for ongoing surveillance. He was counseled that PSA levels will be drawn in the urology office, and was reassured that additional time is expected to improve bowel and bladder symptoms. He was encouraged to call back with concerns or questions regarding radiation.

## 2024-08-15 ENCOUNTER — Other Ambulatory Visit: Payer: Self-pay | Admitting: Urology

## 2024-08-15 DIAGNOSIS — C61 Malignant neoplasm of prostate: Secondary | ICD-10-CM

## 2024-08-20 ENCOUNTER — Telehealth: Payer: Self-pay | Admitting: Radiation Oncology

## 2024-08-20 DIAGNOSIS — C61 Malignant neoplasm of prostate: Secondary | ICD-10-CM | POA: Diagnosis not present

## 2024-08-20 NOTE — Telephone Encounter (Signed)
 11/12 Called patient after received voicemail, patient received mychart message about a sch appt on 11/18, wasn't sure what appt was for.  Patient is aware a post treatment call over the phone. Patient grateful for call back.

## 2024-08-26 ENCOUNTER — Ambulatory Visit
Admission: RE | Admit: 2024-08-26 | Discharge: 2024-08-26 | Disposition: A | Discharge status: Home / Self Care | Source: Ambulatory Visit | Attending: Radiation Oncology | Admitting: Radiation Oncology

## 2024-08-26 DIAGNOSIS — C61 Malignant neoplasm of prostate: Secondary | ICD-10-CM

## 2024-08-27 DIAGNOSIS — R350 Frequency of micturition: Secondary | ICD-10-CM | POA: Diagnosis not present

## 2024-08-27 DIAGNOSIS — R351 Nocturia: Secondary | ICD-10-CM | POA: Diagnosis not present

## 2024-08-27 DIAGNOSIS — C61 Malignant neoplasm of prostate: Secondary | ICD-10-CM | POA: Diagnosis not present

## 2024-08-27 DIAGNOSIS — N401 Enlarged prostate with lower urinary tract symptoms: Secondary | ICD-10-CM | POA: Diagnosis not present

## 2024-08-27 DIAGNOSIS — R3912 Poor urinary stream: Secondary | ICD-10-CM | POA: Diagnosis not present

## 2024-09-02 ENCOUNTER — Encounter: Payer: Self-pay | Admitting: Pharmacist

## 2024-09-02 DIAGNOSIS — E785 Hyperlipidemia, unspecified: Secondary | ICD-10-CM

## 2024-09-03 ENCOUNTER — Ambulatory Visit

## 2024-09-08 ENCOUNTER — Ambulatory Visit: Attending: Cardiology | Admitting: Pharmacist

## 2024-09-08 ENCOUNTER — Other Ambulatory Visit: Payer: Self-pay | Admitting: Cardiology

## 2024-09-08 DIAGNOSIS — E785 Hyperlipidemia, unspecified: Secondary | ICD-10-CM | POA: Diagnosis not present

## 2024-09-08 NOTE — Assessment & Plan Note (Signed)
 Assessment: Recent increase in LDL-C Previously patient was never really consistently below goal of less than 70 either On rosuvastatin  40 mg daily and ezetimibe  10 mg daily We reviewed PCSK9 inhibitor, including dosing and injection technique Patient working on increasing back his physical activity as he was very inactive when he had a catheter  Plan: Submit prior authorization for Repatha Follow-up labs in 2 to 3 months Continue rosuvastatin  40 mg daily and ezetimibe  10 mg daily

## 2024-09-08 NOTE — Progress Notes (Signed)
 Patient ID: Ricardo Arnold                 DOB: 12/15/1950                    MRN: [id number]      HPI: Ricardo Arnold is a 73 y.o. male patient referred to lipid clinic by Dr. Jordan. 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  In July patient's LDL-C was elevated.  Initially this was thought to be due to some confusion where patient had gone back to taking atorvastatin  instead of rosuvastatin .  However his LDL-C was rechecked in October after a few months back on rosuvastatin  and it was still 121.  He was diagnosed with prostate cancer this year and has been on hormone therapy including Erleada and a shot he gets but is unsure of the name.  Patient presents today to lipid clinic to discuss PCSK9 inhibitors.  Discussed with patient that it is quite possible that his increase in LDL cholesterol is due to his medications for his prostate cancer.  Although he is not as active as he once was this would not explain the big change in his LDL cholesterol.  He does have some fear of getting injections but does think that he would be able to do Repatha.  Reviewed that Repatha would be the most effective option for him as Nexletol would not make much dent in his LDL cholesterol.  Due to the cost of his oral cancer medication, patient hits his out-of-pocket max very soon therefore cost of Repatha is not a concern to him.  We reviewed injection technique and dosing.  Current Medications: rosuvastatin  40mg  daily, ezetimibe  10mg  daily Intolerances:  Risk Factors: pre-mature CAD (MI), age, persistently elevated LDL-C LDL-C goal: <70 ApoB goal:   Diet: banana, cherios, w/ granola Lunch: salad w/ chicken or tuna salad Dinner: chicken or fish w/ vegetables, brown rice No beef, soft drinks or fast food Drink: water Snack: nuts  Exercise: kayaking, walking 30-45 min usually 4 times a week, previously biked  Family  History:  Family History  Problem Relation Age of Onset   Coronary artery disease Father        had bypass in his 25's   Heart disease Father    Heart attack Father     Social History: no tobacco, 5-6 drinks per week  Labs: Lipid Panel     Component Value Date/Time   CHOL 203 (H) 07/17/2024 1123   TRIG 135 07/17/2024 1123   HDL 58 07/17/2024 1123   CHOLHDL 3.5 07/17/2024 1123   CHOLHDL 3.1 02/23/2016 0901   VLDL 19 02/23/2016 0901   LDLCALC 121 (H) 07/17/2024 1123   LABVLDL 24 07/17/2024 1123    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   treated with a 3.0 x 18 mm Cypher stent/    Hyperlipidemia    Hypokinesia    with EF of 49%  /by  cardiolite  study   Ischemic heart disease    Upper GI bleed  Current Outpatient Medications on File Prior to Visit  Medication Sig Dispense Refill   aspirin  81 MG EC tablet Take 1 tablet (81 mg total) by mouth daily. Swallow whole.     calcium  carbonate (TUMS - DOSED IN MG ELEMENTAL CALCIUM ) 500 MG chewable tablet Chew 2 tablets by mouth daily.     Cholecalciferol (VITAMIN D-3 PO) Take by mouth daily.     ERLEADA 60 MG tablet Take 240 mg by mouth daily.     ezetimibe  (ZETIA ) 10 MG tablet TAKE 1 TABLET BY MOUTH EVERY DAY 90 tablet 2   finasteride (PROSCAR) 5 MG tablet Take 5 mg by mouth daily.     Multiple Vitamins-Minerals (CENTRUM SILVER PO) Take 1 tablet by mouth daily.     rosuvastatin  (CRESTOR ) 40 MG tablet Take 1 tablet (40 mg total) by mouth daily. 90 tablet 0   tamsulosin (FLOMAX) 0.4 MG CAPS capsule Take 0.4 mg by mouth 2 (two) times daily.     No current facility-administered medications on file prior to visit.    No Known Allergies  Assessment/Plan:  1. Hyperlipidemia -   Hyperlipidemia Assessment: Recent increase in LDL-C Previously patient was never really consistently below goal of less than 70 either On rosuvastatin  40 mg daily and ezetimibe  10 mg daily We reviewed PCSK9 inhibitor, including dosing and injection technique Patient working on increasing back his physical activity as he was very inactive when he had a catheter  Plan: Submit prior authorization for Repatha Follow-up labs in 2 to 3 months Continue rosuvastatin  40 mg daily and ezetimibe  10 mg daily    Thank you,  [place] D Brock Mokry, Pharm.Ricardo Arnold, CPP [facility] A Division of [facility] [address] [place], [place] [address]  Phone: [phone]; Fax: [phone]

## 2024-09-08 NOTE — Patient Instructions (Signed)

## 2024-09-09 ENCOUNTER — Other Ambulatory Visit (HOSPITAL_COMMUNITY): Payer: Self-pay

## 2024-09-09 ENCOUNTER — Telehealth: Payer: Self-pay | Admitting: Pharmacy Technician

## 2024-09-09 MED ORDER — REPATHA SURECLICK 140 MG/ML ~~LOC~~ SOAJ: 1.0000 mL | SUBCUTANEOUS | 3 refills | Status: AC

## 2024-09-09 NOTE — Telephone Encounter (Signed)
 Pharmacy Patient Advocate Encounter   Received notification from Physician's Office that prior authorization for repatha is required/requested.   Insurance verification completed.   The patient is insured through [employer].   Per test claim: PA required; PA submitted to above mentioned insurance via Latent Key/confirmation #/EOC [id number] Status is pending

## 2024-09-09 NOTE — Telephone Encounter (Signed)
 Pharmacy Patient Advocate Encounter  Received notification from [employer] that Prior Authorization for repatha has been APPROVED from 09/09/24 to 10/08/24. Ran test claim, Copay is $0.00- one month. This test claim was processed through [facility]- copay amounts may vary at other pharmacies due to pharmacy/plan contracts, or as the patient moves through the different stages of their insurance plan.   PA #/Case ID/Reference #: [id number]

## 2024-09-16 ENCOUNTER — Telehealth: Payer: Self-pay | Admitting: Pharmacy Technician

## 2024-09-16 NOTE — Telephone Encounter (Addendum)
 Pharmacy Patient Advocate Encounter   Received notification from Physician's Office - Ricardo Arnold  that prior authorization for repatha  renewal is required/requested.   Insurance verification completed.   The patient is insured through [facility].   Per test claim: PA required; PA submitted to above mentioned insurance via Latent Key/confirmation #/EOC [id number] Status is pending

## 2024-09-17 ENCOUNTER — Other Ambulatory Visit (HOSPITAL_COMMUNITY): Payer: Self-pay

## 2024-09-17 NOTE — Telephone Encounter (Signed)
 Pharmacy Patient Advocate Encounter  Received notification from [employer] that Prior Authorization for Repatha  has been APPROVED from 10/09/24 to 10/08/25   PA #/Case ID/Reference #: [id number]

## 2024-09-21 ENCOUNTER — Other Ambulatory Visit: Payer: Self-pay | Admitting: Cardiology
